# Patient Record
Sex: Female | Born: 1935 | Race: White | Hispanic: No | Marital: Married | Smoking: Former smoker
Health system: Southern US, Community
[De-identification: ages and names within clinical notes are randomized; demographics above are authoritative.]

## PROBLEM LIST (undated history)

## (undated) ENCOUNTER — Ambulatory Visit (HOSPITAL_BASED_OUTPATIENT_CLINIC_OR_DEPARTMENT_OTHER): Admission: EM | Source: Home / Self Care

## (undated) DIAGNOSIS — E039 Hypothyroidism, unspecified: Secondary | ICD-10-CM

## (undated) DIAGNOSIS — I10 Essential (primary) hypertension: Secondary | ICD-10-CM

## (undated) HISTORY — PX: CHOLECYSTECTOMY: SHX55

## (undated) HISTORY — PX: BREAST SURGERY: SHX581

## (undated) HISTORY — PX: OTHER SURGICAL HISTORY: SHX169

## (undated) HISTORY — PX: ABDOMINAL HYSTERECTOMY: SHX81

---

## 2004-09-01 ENCOUNTER — Ambulatory Visit: Payer: Self-pay | Admitting: Family Medicine

## 2004-10-06 ENCOUNTER — Ambulatory Visit: Payer: Self-pay | Admitting: Family Medicine

## 2004-10-21 ENCOUNTER — Ambulatory Visit: Payer: Self-pay | Admitting: Family Medicine

## 2004-11-24 ENCOUNTER — Ambulatory Visit: Payer: Self-pay | Admitting: Family Medicine

## 2004-12-10 ENCOUNTER — Ambulatory Visit: Payer: Self-pay | Admitting: Family Medicine

## 2005-04-21 ENCOUNTER — Ambulatory Visit: Payer: Self-pay | Admitting: Family Medicine

## 2005-07-30 ENCOUNTER — Ambulatory Visit: Payer: Self-pay | Admitting: Family Medicine

## 2015-11-01 ENCOUNTER — Encounter (HOSPITAL_COMMUNITY): Payer: Self-pay | Admitting: Family Medicine

## 2015-11-01 ENCOUNTER — Observation Stay (HOSPITAL_COMMUNITY)
Admission: AD | Admit: 2015-11-01 | Discharge: 2015-11-03 | Disposition: A | Discharge status: Home / Self Care | Payer: Medicare HMO | Source: Other Acute Inpatient Hospital | Attending: Internal Medicine | Admitting: Internal Medicine

## 2015-11-01 DIAGNOSIS — I10 Essential (primary) hypertension: Secondary | ICD-10-CM | POA: Diagnosis not present

## 2015-11-01 DIAGNOSIS — R42 Dizziness and giddiness: Secondary | ICD-10-CM | POA: Diagnosis not present

## 2015-11-01 DIAGNOSIS — I129 Hypertensive chronic kidney disease with stage 1 through stage 4 chronic kidney disease, or unspecified chronic kidney disease: Secondary | ICD-10-CM | POA: Diagnosis not present

## 2015-11-01 DIAGNOSIS — M6289 Other specified disorders of muscle: Secondary | ICD-10-CM | POA: Diagnosis not present

## 2015-11-01 DIAGNOSIS — Z87891 Personal history of nicotine dependence: Secondary | ICD-10-CM | POA: Insufficient documentation

## 2015-11-01 DIAGNOSIS — E039 Hypothyroidism, unspecified: Secondary | ICD-10-CM | POA: Diagnosis not present

## 2015-11-01 DIAGNOSIS — Z6829 Body mass index (BMI) 29.0-29.9, adult: Secondary | ICD-10-CM | POA: Insufficient documentation

## 2015-11-01 DIAGNOSIS — R531 Weakness: Secondary | ICD-10-CM | POA: Diagnosis present

## 2015-11-01 DIAGNOSIS — N183 Chronic kidney disease, stage 3 unspecified: Secondary | ICD-10-CM | POA: Diagnosis present

## 2015-11-01 DIAGNOSIS — Z7982 Long term (current) use of aspirin: Secondary | ICD-10-CM | POA: Diagnosis not present

## 2015-11-01 DIAGNOSIS — E669 Obesity, unspecified: Secondary | ICD-10-CM | POA: Insufficient documentation

## 2015-11-01 DIAGNOSIS — I6782 Cerebral ischemia: Secondary | ICD-10-CM | POA: Diagnosis not present

## 2015-11-01 DIAGNOSIS — E785 Hyperlipidemia, unspecified: Secondary | ICD-10-CM | POA: Insufficient documentation

## 2015-11-01 DIAGNOSIS — G459 Transient cerebral ischemic attack, unspecified: Secondary | ICD-10-CM | POA: Diagnosis not present

## 2015-11-01 DIAGNOSIS — K219 Gastro-esophageal reflux disease without esophagitis: Secondary | ICD-10-CM | POA: Insufficient documentation

## 2015-11-01 DIAGNOSIS — G450 Vertebro-basilar artery syndrome: Secondary | ICD-10-CM

## 2015-11-01 HISTORY — DX: Hypothyroidism, unspecified: E03.9

## 2015-11-01 HISTORY — DX: Essential (primary) hypertension: I10

## 2015-11-01 LAB — CBC
HCT: 40.8 % (ref 36.0–46.0)
Hemoglobin: 13.1 g/dL (ref 12.0–15.0)
MCH: 30.2 pg (ref 26.0–34.0)
MCHC: 32.1 g/dL (ref 30.0–36.0)
MCV: 94 fL (ref 78.0–100.0)
PLATELETS: 355 10*3/uL (ref 150–400)
RBC: 4.34 MIL/uL (ref 3.87–5.11)
RDW: 12.9 % (ref 11.5–15.5)
WBC: 6.4 10*3/uL (ref 4.0–10.5)

## 2015-11-01 LAB — COMPREHENSIVE METABOLIC PANEL
ALBUMIN: 3.6 g/dL (ref 3.5–5.0)
ALK PHOS: 55 U/L (ref 38–126)
ALT: 20 U/L (ref 14–54)
ANION GAP: 9 (ref 5–15)
AST: 25 U/L (ref 15–41)
BUN: 19 mg/dL (ref 6–20)
CHLORIDE: 105 mmol/L (ref 101–111)
CO2: 26 mmol/L (ref 22–32)
Calcium: 9.1 mg/dL (ref 8.9–10.3)
Creatinine, Ser: 1.33 mg/dL — ABNORMAL HIGH (ref 0.44–1.00)
GFR calc non Af Amer: 37 mL/min — ABNORMAL LOW (ref >60)
GFR, EST AFRICAN AMERICAN: 43 mL/min — AB (ref >60)
GLUCOSE: 92 mg/dL (ref 65–99)
POTASSIUM: 3.8 mmol/L (ref 3.5–5.1)
SODIUM: 140 mmol/L (ref 135–145)
Total Bilirubin: 0.6 mg/dL (ref 0.3–1.2)
Total Protein: 6.9 g/dL (ref 6.5–8.1)

## 2015-11-01 LAB — PROTIME-INR
INR: 1.03 (ref 0.00–1.49)
Prothrombin Time: 13.7 seconds (ref 11.6–15.2)

## 2015-11-01 LAB — GLUCOSE, CAPILLARY: GLUCOSE-CAPILLARY: 89 mg/dL (ref 65–99)

## 2015-11-01 LAB — TSH: TSH: 3.051 u[IU]/mL (ref 0.350–4.500)

## 2015-11-01 MED ORDER — ASPIRIN 325 MG PO TABS
325.0000 mg | ORAL_TABLET | Freq: Every day | ORAL | Status: DC
  Administered 2015-11-01 – 2015-11-02 (×2): 325 mg via ORAL
  Filled 2015-11-01 (×2): qty 1

## 2015-11-01 MED ORDER — PRAVASTATIN SODIUM 40 MG PO TABS
40.0000 mg | ORAL_TABLET | Freq: Every day | ORAL | Status: DC
  Administered 2015-11-01 – 2015-11-02 (×2): 40 mg via ORAL
  Filled 2015-11-01: qty 1

## 2015-11-01 MED ORDER — STROKE: EARLY STAGES OF RECOVERY BOOK
Freq: Once | Status: AC
  Administered 2015-11-01: 22:00:00

## 2015-11-01 MED ORDER — ACETAMINOPHEN 650 MG RE SUPP: 650.0000 mg | RECTAL | Status: DC | PRN

## 2015-11-01 MED ORDER — SENNOSIDES-DOCUSATE SODIUM 8.6-50 MG PO TABS: 1.0000 | ORAL_TABLET | Freq: Every evening | ORAL | Status: DC | PRN

## 2015-11-01 MED ORDER — SODIUM CHLORIDE 0.9 % IV BOLUS (SEPSIS)
500.0000 mL | Freq: Once | INTRAVENOUS | Status: AC
Start: 2015-11-01 — End: 2015-11-01
  Administered 2015-11-01: 500 mL via INTRAVENOUS

## 2015-11-01 MED ORDER — ENOXAPARIN SODIUM 40 MG/0.4ML ~~LOC~~ SOLN
40.0000 mg | SUBCUTANEOUS | Status: DC
  Administered 2015-11-01 – 2015-11-02 (×2): 40 mg via SUBCUTANEOUS
  Filled 2015-11-01 (×2): qty 0.4

## 2015-11-01 MED ORDER — TRIMETHOPRIM 100 MG PO TABS
100.0000 mg | ORAL_TABLET | Freq: Two times a day (BID) | ORAL | Status: DC
  Administered 2015-11-01 – 2015-11-03 (×4): 100 mg via ORAL
  Filled 2015-11-01 (×4): qty 1

## 2015-11-01 MED ORDER — ACETAMINOPHEN 325 MG PO TABS
650.0000 mg | ORAL_TABLET | ORAL | Status: DC | PRN
  Administered 2015-11-03: 650 mg via ORAL
  Filled 2015-11-01: qty 2

## 2015-11-01 MED ORDER — PANTOPRAZOLE SODIUM 40 MG PO TBEC
40.0000 mg | DELAYED_RELEASE_TABLET | Freq: Every day | ORAL | Status: DC
  Administered 2015-11-01 – 2015-11-03 (×3): 40 mg via ORAL
  Filled 2015-11-01 (×3): qty 1

## 2015-11-01 MED ORDER — AMLODIPINE BESYLATE 10 MG PO TABS
10.0000 mg | ORAL_TABLET | Freq: Every day | ORAL | Status: DC
  Administered 2015-11-02 – 2015-11-03 (×2): 10 mg via ORAL
  Filled 2015-11-01 (×2): qty 1

## 2015-11-01 MED ORDER — BISOPROLOL-HYDROCHLOROTHIAZIDE 10-6.25 MG PO TABS
1.0000 | ORAL_TABLET | Freq: Every day | ORAL | Status: DC
  Administered 2015-11-02: 1 via ORAL
  Filled 2015-11-01 (×2): qty 1

## 2015-11-01 MED ORDER — BENAZEPRIL HCL 20 MG PO TABS
40.0000 mg | ORAL_TABLET | Freq: Every day | ORAL | Status: DC
  Administered 2015-11-02: 40 mg via ORAL
  Filled 2015-11-01: qty 2

## 2015-11-01 MED ORDER — ASPIRIN 300 MG RE SUPP: 300.0000 mg | Freq: Every day | RECTAL | Status: DC

## 2015-11-01 NOTE — Progress Notes (Signed)
Received from [place] transport into [id number]; [facility] alerted to arrival; await admission orders. Patient is arousable; oriented to room and unit routine.

## 2015-11-01 NOTE — Consult Note (Signed)
Admission H&P    Chief Complaint: Acute onset of vertigo and right lower extremity weakness.  HPI: Erin Oliver is an 80 y.o. female history of hypertension, hyperlipidemia and hypothyroidism, transferred from [facility] for further evaluation to rule out acute stroke. She experienced acute onset of vertigo and heaviness involving her right lower extremity with difficulty with ambulation at 1:30 AM today. She has no previous history of stroke nor TIA. She has not been on antiplatelet therapy. CT scan of her head showed no acute intracranial abnormality. Deficits improved rapidly. TPA was not administered. Patient has not had a recurrence of severe ago normal right lower extremity weakness. She had no weakness of her right upper extremity andchanges were noted. There was also no facial droop. NIH stroke score at the time of this evaluation was 0.  LSN: 1:30 AM on 11/01/2015 tPA Given: No: Rapid resolution of deficits mRankin:  Past Medical History  Diagnosis Date  . Hypertension   . Hypothyroidism     Past Surgical History  Procedure Laterality Date  . Breast surgery    . Abdominal hysterectomy    . Cholecystectomy    . Right knee arthroscopy      Family History  Problem Relation Age of Onset  . Leukemia Mother   . Congestive Heart Failure Mother   . Lung cancer Sister   . Heart disease Brother    Social History:  reports that she has quit smoking. Her smoking use included Cigarettes. She does not have any smokeless tobacco history on file. She reports that she does not drink alcohol. Her drug history is not on file.  Allergies:  Allergies  Allergen Reactions  . Penicillins Hives    Medications Prior to Admission  Medication Sig Dispense Refill  . amLODipine (NORVASC) 10 MG tablet Take 10 mg by mouth daily.    . benazepril (LOTENSIN) 40 MG tablet Take 40 mg by mouth daily.    . bisoprolol-hydrochlorothiazide (ZIAC) 10-6.25 MG tablet Take 1 tablet by mouth daily.     Marland Kitchen lovastatin (MEVACOR) 40 MG tablet Take 40 mg by mouth at bedtime.    Marland Kitchen omeprazole (PRILOSEC) 20 MG capsule Take 20 mg by mouth daily.    Marland Kitchen trimethoprim (TRIMPEX) 100 MG tablet Take 100 mg by mouth 2 (two) times daily.      ROS: History obtained from the patient  General ROS: negative for - chills, fatigue, fever, night sweats, weight gain or weight loss Psychological ROS: negative for - behavioral disorder, hallucinations, memory difficulties, mood swings or suicidal ideation Ophthalmic ROS: negative for - blurry vision, double vision, eye pain or loss of vision ENT ROS: negative for - epistaxis, nasal discharge, oral lesions, sore throat, tinnitus or vertigo Allergy and Immunology ROS: negative for - hives or itchy/watery eyes Hematological and Lymphatic ROS: negative for - bleeding problems, bruising or swollen lymph nodes Endocrine ROS: negative for - galactorrhea, hair pattern changes, polydipsia/polyuria or temperature intolerance Respiratory ROS: negative for - cough, hemoptysis, shortness of breath or wheezing Cardiovascular ROS: negative for - chest pain, dyspnea on exertion, edema or irregular heartbeat Gastrointestinal ROS: negative for - abdominal pain, diarrhea, hematemesis, nausea/vomiting or stool incontinence Genito-Urinary ROS: negative for - dysuria, hematuria, incontinence or urinary frequency/urgency Musculoskeletal ROS: negative for - joint swelling or muscular weakness Neurological ROS: as noted in HPI Dermatological ROS: negative for rash and skin lesion changes  Physical Examination: Blood pressure 119/57, pulse 57, temperature 98.1 F (36.7 C), temperature source Oral, resp. rate 16,  height 5\' 4"  (1.626 m), weight 78.79 kg (173 lb 11.2 oz), SpO2 93 %.  HEENT-  Normocephalic, no lesions, without obvious abnormality.  Normal external eye and conjunctiva.  Normal TM's bilaterally.  Normal auditory canals and external ears. Normal external nose, mucus membranes and  septum.  Normal pharynx. Neck supple with no masses, nodes, nodules or enlargement. Cardiovascular - regular rate and rhythm, S1, S2 normal, no murmur, click, rub or gallop Lungs - chest clear, no wheezing, rales, normal symmetric air entry Abdomen - soft, non-tender; bowel sounds normal; no masses,  no organomegaly Extremities - no joint deformities, effusion, or inflammation and no edema  Neurologic Examination: Mental Status: Alert, oriented, thought content appropriate.  Speech fluent without evidence of aphasia. Able to follow commands without difficulty. Cranial Nerves: II-Visual fields were normal. III/IV/VI-Pupils were equal and reacted normally to light. Extraocular movements were full and conjugate.    V/VII-no facial numbness and no facial weakness. VIII-normal. X-normal speech and symmetrical palatal movement. XI: trapezius strength/neck flexion strength normal bilaterally XII-midline tongue extension with normal strength. Motor: 5/5 bilaterally with normal tone and bulk Sensory: Normal throughout. Deep Tendon Reflexes: 1+ and symmetric. Plantars: Mute bilaterally Cerebellar: Normal finger-to-nose testing. Carotid auscultation: Normal  Results for orders placed or performed during the hospital encounter of 11/01/15 (from the past 48 hour(s))  Glucose, capillary     Status: None   Collection Time: 11/01/15  7:44 PM  Result Value Ref Range   Glucose-Capillary 89 65 - 99 mg/dL   Comment 1 Notify RN    Comment 2 Document in Chart   CBC     Status: None   Collection Time: 11/01/15  8:55 PM  Result Value Ref Range   WBC 6.4 4.0 - 10.5 K/uL   RBC 4.34 3.87 - 5.11 MIL/uL   Hemoglobin 13.1 12.0 - 15.0 g/dL   HCT 40.8 36.0 - 46.0 %   MCV 94.0 78.0 - 100.0 fL   MCH 30.2 26.0 - 34.0 pg   MCHC 32.1 30.0 - 36.0 g/dL   RDW 12.9 11.5 - 15.5 %   Platelets 355 150 - 400 K/uL  Protime-INR     Status: None   Collection Time: 11/01/15  8:55 PM  Result Value Ref Range    Prothrombin Time 13.7 11.6 - 15.2 seconds   INR 1.03 0.00 - 1.49   No results found.  Assessment: 80 y.o. female with multiple risk factors for stroke presenting with probable transient ischemic attack. However, a small acute infarction cannot be ruled out at this point.  Stroke Risk Factors - hyperlipidemia and hypertension  Plan: 1. HgbA1c, fasting lipid panel 2. MRI, MRA  of the brain without contrast 3. PT consult, OT consult, Speech consult 4. Echocardiogram 5. Carotid dopplers 6. Prophylactic therapy-Antiplatelet med: Aspirin  7. Risk factor modification 8. Telemetry monitoring  C.R. Nicole Kindred, MD [facility] [phone]  11/01/2015, 9:45 PM

## 2015-11-01 NOTE — H&P (Signed)
History and Physical  Patient Name: Gwendy Redhead Riveron     [id number]    DOB: 10-Nov-1935    DOA: 11/01/2015 PCP: No primary care provider on file.   Patient coming from: Home     Chief Complaint: Vertigo  HPI: NIAYLA DEWAN is a 80 y.o. female with a past medical history significant for HTN who presents with vertigo and blurry vision.  The patient was in her usual state of health until last night 5/13 at 0130 when she got up from bed, felt sudden onset severe vertigo with sitting up, then nausea and NBNB emesis as well as blurry vision spinning vision.  She presented to the ER at [facility] where she was noted to have R > L tremor and right leg weakness and blurry vision.  CT head was negative, MRI was not available and tPA was not administered (presumably because her vision and right leg weakness were reportedly resolving) but the patient was requested to be transferred to [facility].       Review of Systems:  Pt complains of blurry vision, mild vertigo, malaise, nausea, headache, fatigue, discomfort in bilateral ears "like fluid in my head", photophobia, right leg weakness, resolved. Pt denies any numbness, slurred speech, dysarthria, difficulty swallowing or choking.  All other systems negative except as just noted or noted in the history of present illness.  Past Medical History  Diagnosis Date  . Hypertension   . Hypothyroidism     Past Surgical History  Procedure Laterality Date  . Breast surgery    . Abdominal hysterectomy    . Cholecystectomy    . Right knee arthroscopy      Social History: Patient lives with her husband and son.  Patient walks unassisted.  She mows the lawn and tills the yard.  She is a remote former smoker.  No alcohol.    Allergies  Allergen Reactions  . Penicillins Hives    Family history: family history includes Congestive Heart Failure in her mother; Heart disease in her brother; Leukemia in her mother; Lung cancer in her  sister.  Prior to Admission medications   Medication Sig Start Date End Date Taking? Authorizing Provider  amLODipine (NORVASC) 10 MG tablet Take 10 mg by mouth daily.   Yes Historical Provider, MD  benazepril (LOTENSIN) 40 MG tablet Take 40 mg by mouth daily.   Yes Historical Provider, MD  bisoprolol-hydrochlorothiazide (ZIAC) 10-6.25 MG tablet Take 1 tablet by mouth daily.   Yes Historical Provider, MD  lovastatin (MEVACOR) 40 MG tablet Take 40 mg by mouth at bedtime.   Yes Historical Provider, MD  omeprazole (PRILOSEC) 20 MG capsule Take 20 mg by mouth daily.   Yes Historical Provider, MD  trimethoprim (TRIMPEX) 100 MG tablet Take 100 mg by mouth 2 (two) times daily.   Yes Historical Provider, MD     Physical Exam: BP 119/57 mmHg  Pulse 57  Temp(Src) 98.1 F (36.7 C) (Oral)  Resp 16  Ht 5\' 4"  (1.626 m)  Wt 78.79 kg (173 lb 11.2 oz)  BMI 29.80 kg/m2  SpO2 93% General appearance: Well-developed, elderly adult female, alert and in mild distress from nausea.   Eyes: Anicteric, conjunctiva pink, lids and lashes normal. Right ptyrgium.      ENT: No nasal deformity, discharge, or epistaxis.  OP moist without lesions.  TMs bilaterally reflective, red but without effusion or bulging. Lymph: No cervical, supraclavicular or axillary lymphadenopathy.  No thyromegaly. Skin: Warm and dry.  No jaundice.  No suspicious rashes or lesions. Cardiac: RRR, nl S1-S2, no murmurs appreciated.  Capillary refill is brisk.  No LE edema.  Radial and DP pulses 2+ and symmetric. Respiratory: Normal respiratory rate and rhythm.  CTAB without rales or wheezes. Abdomen: Abdomen soft without rigidity.  No rebound or guarding. No ascites, distension.   MSK: No deformities or effusions. Neuro: Pupils are 5 mm and reactive to 3 mm. Extraocular movements are intact, without nystagmus.Saccade noted with horizontal tracking to RIGHT. Cranial nerve 5 is within normal limits. Cranial nerve 7 is symmetrical. Cranial  nerve 8 is within normal limits. Cranial nerves 9 and 10 reveal equal palate elevation. Cranial nerve 11 reveals sternocleidomastoid strong. Cranial nerve 12 is midline. I do not note a deficit in motor strength testing in the upper and lower extremities bilaterally with normal motor, tone and bulk for age. There is overall 4/5 strength in both legs.  Finger-to-nose testing has intention tremor on RIGHT. The patient is oriented to time, place and person. Speech is fluent. Naming is grossly intact. Recall, recent and remote, as well as general fund of knowledge seem within normal limits. Attention span and concentration are within normal limits.   Psych: Behavior appropriate.  Affect anxious.  No evidence of aural or visual hallucinations or delusions.       Labs on Admission from [facility]:  The metabolic panel shows Na [id number], K 4.2, Cr 1.4 (no baseline), BUN to creatinine somewhat up.  Glucose normal.  AG normal. LFT normal. INR 1 Troponin I negative x2. UA without pyuria or hematuria. The complete blood count shows no leukocytosis, anemia and platelets 338.   Radiological Exams on Admission: CT head without contrast 11/01/2015  Negative  CXR report reviewed from [facility]: Negative for infiltrate or edema    EKG: Independently reviewed. Rate 67, QTc 407, nonspecific anterior repol abnormalities without ST changes    Assessment/Plan 1. Acute Stroke or TIA:  This is new.  ABCD score 3.  Posterior circulation syndrome suspected. -Admit to telemetry -Neuro checks, NIHSS per protocol -Daily aspirin 325 mg -Lipids, hemoglobin A1c -MRI and MRA of head/brain ordered -Consult to Neurology, appreciate recommendations -Carotid imaging and echo ordered -PT/OT/SLP ordered   2. Possible hypothyroidism:  Reports elevated TSH this spring -Check TSH  3. HTN:  -Continue home amlodipine, benazepril, HCTZ and bisoprolol  4. GERD:  Stable.  -Continue PPI      DVT  prophylaxis: Lovenox  Code Status: FULL  Family Communication: Son and husband at bedside, CODE STATUS confirmed  Disposition Plan: Anticipate Stroke work up as above and consult to ancillary services.  Expect discharge within 1-2 days. Consults called: Neurology, Dr. Nicole Kindred has seen patient. Admission status: Telemetry, observation status   Medical decision making: Patient seen at 8:25 PM on 11/01/2015.  The patient was discussed with Dr. Nicole Kindred. What exists of the patient's chart from [facility] was reviewed in depth.  Clinical condition: stable.       Edwin Dada [facility] Pager [phone]

## 2015-11-02 ENCOUNTER — Observation Stay (HOSPITAL_COMMUNITY): Payer: Medicare HMO

## 2015-11-02 ENCOUNTER — Observation Stay (HOSPITAL_BASED_OUTPATIENT_CLINIC_OR_DEPARTMENT_OTHER): Payer: Medicare HMO

## 2015-11-02 DIAGNOSIS — I10 Essential (primary) hypertension: Secondary | ICD-10-CM

## 2015-11-02 DIAGNOSIS — R42 Dizziness and giddiness: Secondary | ICD-10-CM | POA: Diagnosis not present

## 2015-11-02 DIAGNOSIS — N183 Chronic kidney disease, stage 3 (moderate): Secondary | ICD-10-CM | POA: Diagnosis not present

## 2015-11-02 DIAGNOSIS — G459 Transient cerebral ischemic attack, unspecified: Secondary | ICD-10-CM

## 2015-11-02 DIAGNOSIS — R55 Syncope and collapse: Secondary | ICD-10-CM

## 2015-11-02 DIAGNOSIS — M6289 Other specified disorders of muscle: Secondary | ICD-10-CM

## 2015-11-02 LAB — LIPID PANEL
Cholesterol: 149 mg/dL (ref 0–200)
HDL: 36 mg/dL — ABNORMAL LOW (ref >40)
LDL Cholesterol: 70 mg/dL (ref 0–99)
Total CHOL/HDL Ratio: 4.1 RATIO
Triglycerides: 214 mg/dL — ABNORMAL HIGH (ref <150)
VLDL: 43 mg/dL — ABNORMAL HIGH (ref 0–40)

## 2015-11-02 LAB — GLUCOSE, CAPILLARY
GLUCOSE-CAPILLARY: 138 mg/dL — AB (ref 65–99)
GLUCOSE-CAPILLARY: 103 mg/dL — AB (ref 65–99)
Glucose-Capillary: 132 mg/dL — ABNORMAL HIGH (ref 65–99)
Glucose-Capillary: 102 mg/dL — ABNORMAL HIGH (ref 65–99)

## 2015-11-02 LAB — ECHOCARDIOGRAM COMPLETE
Height: 64 in
WEIGHTICAEL: 2779.2 [oz_av]

## 2015-11-02 MED ORDER — ASPIRIN 81 MG PO CHEW
81.0000 mg | CHEWABLE_TABLET | Freq: Every day | ORAL | Status: DC
  Administered 2015-11-02 – 2015-11-03 (×2): 81 mg via ORAL
  Filled 2015-11-02 (×2): qty 1

## 2015-11-02 MED ORDER — FENOFIBRATE 54 MG PO TABS
54.0000 mg | ORAL_TABLET | Freq: Every day | ORAL | Status: DC
  Administered 2015-11-02 – 2015-11-03 (×2): 54 mg via ORAL
  Filled 2015-11-02 (×2): qty 1

## 2015-11-02 NOTE — Progress Notes (Signed)
PROGRESS NOTE                                                                                                                                                                                                             Patient Demographics:    Erin Oliver, is a 80 y.o. female, DOB - [birth year], [id number]  Admit date - 11/01/2015   Admitting Physician Edwin Dada, MD  Outpatient Primary MD for the patient is No primary care provider on file.  LOS - 1  Outpatient Specialists:   No chief complaint on file.      Brief Narrative   Erin Oliver is a 80 y.o. female with a past medical history significant for HTN who presents with vertigo and blurry vision.  The patient was in her usual state of health until last night 5/13 at 0130 when she got up from bed, felt sudden onset severe vertigo with sitting up, then nausea and NBNB emesis as well as blurry vision spinning vision. She presented to the ER at [facility] where she was noted to have R > L tremor and right leg weakness and blurry vision. CT head was negative, MRI was not available and tPA was not administered (presumably because her vision and right leg weakness were reportedly resolving) but the patient was requested to be transferred to [facility].   Subjective:    Erin Oliver today has, No headache, No chest pain, No abdominal pain - No Nausea, No new weakness tingling or numbness, No Cough - SOB.  Says her head feels heavy today.   Assessment  & Plan :     1. Vertigo. Seems to have resolved. Initial suspicion for CVA, MRI brain rules out CVA, TIA workup pending, LDL is 70, neuro following. Complete full TIA workup. PT eval. Increase activity. If stable discharge in the morning.  2. Dyslipidemia. LDL 70, triglycerides elevated, continue statin at home dose, added TriCor.  3. Hypertension. Continue home medications which include HCTZ, bisoprolol,  ACE inhibitor and Norvasc.  4. GERD. On PPI.    Code Status : Full  Family Communication  : None present  Disposition Plan  : Home in the morning  Barriers For Discharge : TIA workup  Consults  :  Neuro  Procedures  :   MRI/MRA brain. No stroke. Nonacute.  Echogram  Carotid duplex  DVT Prophylaxis  :  Lovenox    Lab Results  Component Value Date   PLT 355 11/01/2015    Antibiotics  :     Anti-infectives    Start     Dose/Rate Route Frequency Ordered Stop   11/01/15 2200  trimethoprim (TRIMPEX) tablet 100 mg     100 mg Oral 2 times daily 11/01/15 2024          Objective:   Filed Vitals:   11/01/15 2330 11/02/15 0130 11/02/15 0330 11/02/15 0530  BP: 115/56 111/58 117/70 120/60  Pulse: 60 59  62  Temp: 98 F (36.7 C) 98 F (36.7 C) 97.9 F (36.6 C) 97.6 F (36.4 C)  TempSrc: Oral Oral Oral Oral  Resp: 18 18 18 18   Height:      Weight:      SpO2: 95% 95% 96% 95%    Wt Readings from Last 3 Encounters:  11/01/15 78.79 kg (173 lb 11.2 oz)     Intake/Output Summary (Last 24 hours) at 11/02/15 1041 Last data filed at 11/02/15 0856  Gross per 24 hour  Intake    360 ml  Output      0 ml  Net    360 ml     Physical Exam  Awake Alert, Oriented X 3, No new F.N deficits, Normal affect [place].AT,PERRAL Supple Neck,No JVD, No cervical lymphadenopathy appriciated.  Symmetrical Chest wall movement, Good air movement bilaterally, CTAB RRR,No Gallops,Rubs or new Murmurs, No Parasternal Heave +ve B.Sounds, Abd Soft, No tenderness, No organomegaly appriciated, No rebound - guarding or rigidity. No Cyanosis, Clubbing or edema, No new Rash or bruise       Data Review:    CBC  Recent Labs Lab 11/01/15 2055  WBC 6.4  HGB 13.1  HCT 40.8  PLT 355  MCV 94.0  MCH 30.2  MCHC 32.1  RDW 12.9    Chemistries   Recent Labs Lab 11/01/15 2055  NA 140  K 3.8  CL 105  CO2 26  GLUCOSE 92  BUN 19  CREATININE 1.33*  CALCIUM 9.1  AST 25  ALT 20    ALKPHOS 55  BILITOT 0.6   ------------------------------------------------------------------------------------------------------------------  Recent Labs  11/02/15 0538  CHOL 149  HDL 36*  LDLCALC 70  TRIG 214*  CHOLHDL 4.1    No results found for: HGBA1C ------------------------------------------------------------------------------------------------------------------  Recent Labs  11/01/15 2055  TSH 3.051   ------------------------------------------------------------------------------------------------------------------ No results for input(s): VITAMINB12, FOLATE, FERRITIN, TIBC, IRON, RETICCTPCT in the last 72 hours.  Coagulation profile  Recent Labs Lab 11/01/15 2055  INR 1.03    No results for input(s): DDIMER in the last 72 hours.  Cardiac Enzymes No results for input(s): CKMB, TROPONINI, MYOGLOBIN in the last 168 hours.  Invalid input(s): CK ------------------------------------------------------------------------------------------------------------------ No results found for: BNP  Inpatient Medications  Scheduled Meds: . amLODipine  10 mg Oral Daily  . aspirin  300 mg Rectal Daily   Or  . aspirin  325 mg Oral Daily  . benazepril  40 mg Oral Daily  . bisoprolol-hydrochlorothiazide  1 tablet Oral Daily  . enoxaparin (LOVENOX) injection  40 mg Subcutaneous Q24H  . pantoprazole  40 mg Oral Daily  . pravastatin  40 mg Oral q1800  . trimethoprim  100 mg Oral BID   Continuous Infusions:  PRN Meds:.acetaminophen **OR** acetaminophen, senna-docusate  Micro Results No results found for this or any previous visit (from the past 240 hour(s)).  Radiology Reports Mr Brain  Wo Contrast  11/02/2015  CLINICAL DATA:  Personal history of hypertension and hyperlipidemia. Acute onset of vertigo and right-sided weakness. EXAM: MRI HEAD WITHOUT CONTRAST MRA HEAD WITHOUT CONTRAST TECHNIQUE: Multiplanar, multiecho pulse sequences of the brain and surrounding structures  were obtained without intravenous contrast. Angiographic images of the head were obtained using MRA technique without contrast. COMPARISON:  None. FINDINGS: MRI HEAD FINDINGS Diffusion imaging does not show any acute or subacute infarction. The brainstem and cerebellum are normal. Within the cerebral hemispheres, there are scattered foci of T2 and FLAIR signal within the deep and subcortical white matter consistent with ordinary small vessel disease, mild to moderate in degree. No cortical or large vessel territory infarction. No mass lesion, hemorrhage, hydrocephalus or extra-axial collection. No pituitary mass. No inflammatory sinus disease. Tiny amount of fluid in the mastoid air cells, probably subclinical. MRA HEAD FINDINGS Both internal carotid arteries are widely patent into the brain. The anterior and middle cerebral vessels are normal without proximal stenosis, aneurysm or vascular malformation. Fetal origin of both posterior cerebral arteries. 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 Both vertebral arteries are patent to the basilar. No basilar stenosis. Posterior circulation branch vessels are normal. Posterior circulation vessels are somewhat diminutive due to the fetal origin of the PCAs. IMPRESSION: No acute finding. Mild to moderate chronic small-vessel ischemic change of the cerebral hemispheric white matter, fairly typical for age. Negative intracranial MR angiography. No cause of the presenting symptoms is identified. Electronically Signed   By: Nelson Chimes M.D.   On: 11/02/2015 07:11   Mr Lovenia Kim  11/02/2015  CLINICAL DATA:  Personal history of hypertension and hyperlipidemia. Acute onset of vertigo and right-sided weakness. EXAM: MRI HEAD WITHOUT CONTRAST MRA HEAD WITHOUT CONTRAST TECHNIQUE: Multiplanar, multiecho pulse sequences of the brain and surrounding structures were obtained without intravenous contrast. Angiographic images of the head were obtained using MRA  technique without contrast. COMPARISON:  None. FINDINGS: MRI HEAD FINDINGS Diffusion imaging does not show any acute or subacute infarction. The brainstem and cerebellum are normal. Within the cerebral hemispheres, there are scattered foci of T2 and FLAIR signal within the deep and subcortical white matter consistent with ordinary small vessel disease, mild to moderate in degree. No cortical or large vessel territory infarction. No mass lesion, hemorrhage, hydrocephalus or extra-axial collection. No pituitary mass. No inflammatory sinus disease. Tiny amount of fluid in the mastoid air cells, probably subclinical. MRA HEAD FINDINGS Both internal carotid arteries are widely patent into the brain. The anterior and middle cerebral vessels are normal without proximal stenosis, aneurysm or vascular malformation. Fetal origin of both posterior cerebral arteries. 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 Both vertebral arteries are patent to the basilar. No basilar stenosis. Posterior circulation branch vessels are normal. Posterior circulation vessels are somewhat diminutive due to the fetal origin of the PCAs. IMPRESSION: No acute finding. Mild to moderate chronic small-vessel ischemic change of the cerebral hemispheric white matter, fairly typical for age. Negative intracranial MR angiography. No cause of the presenting symptoms is identified. Electronically Signed   By: Nelson Chimes M.D.   On: 11/02/2015 07:11    Time Spent in minutes  30   Amberrose Friebel K M.D on 11/02/2015 at 10:41 AM  Between 7am to 7pm - Pager - [phone]  After 7pm go to [email or web address] - password [facility]  [facility] -  Office  [phone]

## 2015-11-02 NOTE — Progress Notes (Signed)
*  PRELIMINARY RESULTS* Vascular Ultrasound Carotid Duplex (Doppler) has been completed.  There is no obvious evidence of hemodynamically significant internal carotid artery stenosis bilaterally. Vertebral arteries are patent with antegrade flow.  11/02/2015 6:25 PM Maudry Mayhew, RVT, RDCS, RDMS

## 2015-11-02 NOTE — Progress Notes (Signed)
STROKE TEAM PROGRESS NOTE   HISTORY OF PRESENT ILLNESS Erin Oliver is an 80 y.o. female history of hypertension, hyperlipidemia and hypothyroidism, transferred from [facility] for further evaluation to rule out acute stroke. She experienced acute onset of vertigo and heaviness involving her right lower extremity with difficulty with ambulation at 1:30 AM today. She has no previous history of stroke nor TIA. She has not been on antiplatelet therapy. CT scan of her head showed no acute intracranial abnormality. Deficits improved rapidly. TPA was not administered. Patient has not had a recurrence of severe ago normal right lower extremity weakness. She had no weakness of her right upper extremity andchanges were noted. There was also no facial droop. NIH stroke score at the time of this evaluation was 0.  LSN: 1:30 AM on 11/01/2015 tPA Given: No: Rapid resolution of deficits mRankin:   SUBJECTIVE (INTERVAL HISTORY) Patient is better, she has a similar history of vertigo for years but this was worse. She is having left ear fluid. Patient noticed vertigo in bed. She started throwing up when she rolled over. But the symptoms were never this bad. Denies any weakness with symptoms.    OBJECTIVE Temp:  [97.6 F (36.4 C)-98.4 F (36.9 C)] 97.6 F (36.4 C) (05/14 0530) Pulse Rate:  [57-79] 62 (05/14 0530) Cardiac Rhythm:  [-] Normal sinus rhythm (05/13 2109) Resp:  [16-18] 18 (05/14 0530) BP: (111-121)/(56-70) 120/60 mmHg (05/14 0530) SpO2:  [93 %-96 %] 95 % (05/14 0530) Weight:  [78.79 kg (173 lb 11.2 oz)] 78.79 kg (173 lb 11.2 oz) (05/13 1931)  CBC:  Recent Labs Lab 11/01/15 2055  WBC 6.4  HGB 13.1  HCT 40.8  MCV 94.0  PLT [id number]    Basic Metabolic Panel:  Recent Labs Lab 11/01/15 2055  NA 140  K 3.8  CL 105  CO2 26  GLUCOSE 92  BUN 19  CREATININE 1.33*  CALCIUM 9.1    Lipid Panel:    Component Value Date/Time   CHOL 149 11/02/2015 0538   TRIG 214* 11/02/2015 0538    HDL 36* 11/02/2015 0538   CHOLHDL 4.1 11/02/2015 0538   VLDL 43* 11/02/2015 0538   LDLCALC 70 11/02/2015 0538   HgbA1c: No results found for: HGBA1C Urine Drug Screen: No results found for: LABOPIA, COCAINSCRNUR, LABBENZ, AMPHETMU, THCU, LABBARB    IMAGING  Mr Brain Wo Contrast 11/02/2015   No acute finding. Mild to moderate chronic small-vessel ischemic change of the cerebral hemispheric white matter, fairly typical for age. Negative intracranial MR angiography. No cause of the presenting symptoms is identified.     Mr Lovenia Kim 11/02/2015   No acute finding. Mild to moderate chronic small-vessel ischemic change of the cerebral hemispheric white matter, fairly typical for age. Negative intracranial MR angiography. No cause of the presenting symptoms is identified.   PHYSICAL EXAM  Physical exam: Exam: Gen: NAD, conversant, well nourised, obese, well groomed                     CV: RRR, no MRG. No Carotid Bruits. No peripheral edema, warm, nontender Eyes: Conjunctivae clear without exudates or hemorrhage  Neuro: Detailed Neurologic Exam  Speech:    Speech is normal; fluent and spontaneous with normal comprehension.  Cognition:    The patient is oriented to person, place, and time;    Cranial Nerves:    The pupils are equal, round, and reactive to light. Visual fields are full to finger confrontation. Extraocular movements are  intact. Trigeminal sensation is intact and the muscles of mastication are normal. The face is symmetric. The palate elevates in the midline. Hearing intact. Voice is normal. Shoulder shrug is normal. The tongue has normal motion without fasciculations.   Coordination:    No dysmetria Motor Observation:    No asymmetry, no atrophy, and no involuntary movements noted. Tone:    Normal muscle tone.      Strength:    Strength is V/V in the upper and lower limbs.      Sensation: intact to LT     Reflex Exam:  Toes:    The toes are downgoing  bilaterally.   Clonus:    Clonus is absent.       ASSESSMENT/PLAN [name] is a 80 y.o. female with history of hypertension, hyperlipidemia, and hypothyroidism presenting with vertigo and heaviness of the right lower extremity.  She did not receive IV t-PA due to rapid resolution of deficits.  Likely benign peripheral vertigo given history from patient today. She has fluid in the left ear. she has a similar history of vertigo for years but this was worse.  Patient noticed vertigo in bed. She started throwing up when she rolled over. But the symptoms were never this bad but past episodes similar. Denies any weakness with symptoms or any other associated deficits. Exam is non focal.    Resultant  resolved  MRI  no acute findings  MRA  negative  Carotid Doppler pending  2D Echo  EF 60-65%. No cardiac source of emboli identified.  LDL - 70  HgbA1c pending  VTE prophylaxis - Lovenox  Diet heart healthy/carb modified Room service appropriate?: Yes; Fluid consistency:: Thin  No antithrombotic prior to admission, now on aspirin 81 mg daily  Patient counseled to be compliant with her antithrombotic medications  Ongoing aggressive stroke risk factor management  Therapy recommendations: Home health physical therapy recommended  Disposition: Pending  Hypertension  Stable  Permissive hypertension (OK if < 220/120) but gradually normalize in 5-7 days  Hyperlipidemia  Home meds:  Mevacor 40 mg daily resumed in hospital  LDL 70, goal < 70  Continue statin at discharge    Other Stroke Risk Factors  Advanced age  Cigarette smoker, quit smoking.  Obesity, Body mass index is 29.8 kg/(m^2).    Other Active Problems  Mildly elevated creatinine 1.33  Hospital day # 1  Stroke team will sing off. She can be discharged on ASA 81mg  daily given small-vessel chronic ischemic disease on MRI.    Personally examined patient and images, and have participated in  and made any corrections needed to history, physical, neuro exam,assessment and plan as stated above.  I have personally obtained the history, evaluated lab date, reviewed imaging studies and agree with radiology interpretations.    Sarina Ill, MD Stroke Neurology [phone] [facility].       To contact Stroke Continuity provider, please refer to [email or web address] After hours, contact General Neurology

## 2015-11-02 NOTE — Evaluation (Signed)
Physical Therapy Evaluation Patient Details Name: Erin Oliver MRN: [id number] DOB: [birth year] Today's Date: 11/02/2015   History of Present Illness    80 y.o. female history of hypertension, hyperlipidemia and hypothyroidism, transferred from [facility] for further evaluation to rule out acute stroke. She experienced acute onset of vertigo and heaviness involving her right lower extremity with difficulty with ambulation at 1:30 AM today. She has no previous history of stroke nor TIA. She has not been on antiplatelet therapy. CT scan of her head showed no acute intracranial abnormality. Deficits improved rapidly. TPA was not administered. Patient has not had a recurrence of severe ago normal right lower extremity weakness. She had no weakness of her right upper extremity andchanges were noted. There was also no facial droop. NIH stroke score at the time of this evaluation was 0.   Clinical Impression  Pt presents with mild to moderate limitations to functional activity due to disequilibrium with activity, rapid onset of fatigue, resulting in dependency in gait requiring RW and at least supervision of another person for safety.  Expect pt will benefit from HHPT to assess continual resolution of symptoms.  Pt instructed to slowly incr activity to return to prior level, discuss appropriate use of antivertiginous medication with physician, to use RW at home for first few days as she regains stability, and to ask son to check in on her frequently initially after d/c.  No further acute PT needs, please ask RNCM to assist with HHPT.  Thank you    Follow Up Recommendations Home health PT    Equipment Recommendations  None recommended by PT    Recommendations for Other Services       Precautions / Restrictions Precautions Precautions: Fall Precaution Comments: use RW      Mobility  Bed Mobility Overal bed mobility: Independent                Transfers Overall transfer level:  Needs assistance Equipment used: Rolling walker (2 wheeled) Transfers: Sit to/from Stand Sit to Stand: Min guard         General transfer comment: standby for safety and to cue proper hand placement  Ambulation/Gait Ambulation/Gait assistance: Min assist Ambulation Distance (Feet): 100 Feet Assistive device: 1 person hand held assist;Rolling walker (2 wheeled) Gait Pattern/deviations: Step-through pattern;Staggering left;Staggering right;Trunk flexed;Decreased stride length Gait velocity: unmeasured but decr   General Gait Details: pt generally steady but when distracted or with chagning directions stumbles or drifts; endorses "a little" dizziness, better than at onset of symptoms but still disruptive  Stairs            Wheelchair Mobility    Modified Rankin (Stroke Patients Only) Modified Rankin (Stroke Patients Only) Pre-Morbid Rankin Score: No symptoms Modified Rankin: Moderately severe disability     Balance Overall balance assessment: Needs assistance Sitting-balance support: No upper extremity supported;Feet supported Sitting balance-Leahy Scale: Good     Standing balance support: During functional activity;No upper extremity supported Standing balance-Leahy Scale: Poor Standing balance comment: unable to maintain quiet standing unsupported, though requires minimal input to steady self; dynamic balance impacted with time, distraction,a nd course change         Rhomberg - Eyes Opened: 20 Rhomberg - Eyes Closed: 5                 Pertinent Vitals/Pain Pain Assessment: No/denies pain    Home Living Family/patient expects to be discharged to:: Private residence Living Arrangements: Spouse/significant other Available Help at Discharge: Family;Available PRN/intermittently  Type of Home: House Home Access: Level entry     Home Layout: One level Home Equipment: Walker - 2 wheels Additional Comments: 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; son can  check in on intermittently    Prior Function Level of Independence: Independent         Comments: does yard work without problem PTA     Hand Dominance   Dominant Hand: Right    Extremity/Trunk Assessment   Upper Extremity Assessment: Overall WFL for tasks assessed           Lower Extremity Assessment: Overall WFL for tasks assessed      Cervical / Trunk Assessment: Normal  Communication   Communication: No difficulties  Cognition Arousal/Alertness: Awake/alert Behavior During Therapy: WFL for tasks assessed/performed Overall Cognitive Status: Within Functional Limits for tasks assessed                      General Comments General comments (skin integrity, edema, etc.): instructed to slowly incr activity, to have help x 2-3 days, to take HHPT, to use RW and to take antivertig meds as needed for short term (as agreed upon with MD)    Exercises        Assessment/Plan    PT Assessment All further PT needs can be met in the next venue of care  PT Diagnosis Difficulty walking   PT Problem List Decreased activity tolerance;Decreased balance;Decreased mobility  PT Treatment Interventions     PT Goals (Current goals can be found in the Care Plan section) Acute Rehab PT Goals Patient Stated Goal: return to normal PT Goal Formulation: All assessment and education complete, DC therapy    Frequency     Barriers to discharge        Co-evaluation               End of Session   Activity Tolerance: Patient limited by fatigue Patient left: in bed;with call bell/phone within reach Nurse Communication: Mobility status    Functional Assessment Tool Used: rhomberg Functional Limitation: Mobility: Walking and moving around Mobility: Walking and Moving Around Current Status (V9563): At least 20 percent but less than 40 percent impaired, limited or restricted Mobility: Walking and Moving Around Goal Status [phone]): At least 1 percent but less than 20  percent impaired, limited or restricted    Time: 1502-1530 PT Time Calculation (min) (ACUTE ONLY): 28 min   Charges:   PT Evaluation $PT Eval Moderate Complexity: 1 Procedure PT Treatments $Self Care/Home Management: 8-22   PT G Codes:   PT G-Codes **NOT FOR INPATIENT CLASS** Functional Assessment Tool Used: rhomberg Functional Limitation: Mobility: Walking and moving around Mobility: Walking and Moving Around Current Status (P3295): At least 20 percent but less than 40 percent impaired, limited or restricted Mobility: Walking and Moving Around Goal Status [phone]): At least 1 percent but less than 20 percent impaired, limited or restricted    Herbie Drape 11/02/2015, 4:12 PM

## 2015-11-03 DIAGNOSIS — R42 Dizziness and giddiness: Secondary | ICD-10-CM | POA: Diagnosis not present

## 2015-11-03 DIAGNOSIS — N183 Chronic kidney disease, stage 3 (moderate): Secondary | ICD-10-CM | POA: Diagnosis not present

## 2015-11-03 DIAGNOSIS — M6289 Other specified disorders of muscle: Secondary | ICD-10-CM | POA: Diagnosis not present

## 2015-11-03 DIAGNOSIS — I10 Essential (primary) hypertension: Secondary | ICD-10-CM | POA: Diagnosis not present

## 2015-11-03 LAB — GLUCOSE, CAPILLARY
Glucose-Capillary: 96 mg/dL (ref 65–99)
Glucose-Capillary: 86 mg/dL (ref 65–99)

## 2015-11-03 LAB — HEMOGLOBIN A1C
Hgb A1c MFr Bld: 5.8 % — ABNORMAL HIGH (ref 4.8–5.6)
MEAN PLASMA GLUCOSE: 120 mg/dL

## 2015-11-03 MED ORDER — MECLIZINE HCL 12.5 MG PO TABS
25.0000 mg | ORAL_TABLET | Freq: Three times a day (TID) | ORAL | Status: DC | PRN
Start: 2015-11-03 — End: 2015-11-03

## 2015-11-03 MED ORDER — ASPIRIN 81 MG PO CHEW: 81.0000 mg | CHEWABLE_TABLET | Freq: Every day | ORAL | Status: AC

## 2015-11-03 MED ORDER — MECLIZINE HCL 25 MG PO TABS: 25.0000 mg | ORAL_TABLET | Freq: Three times a day (TID) | ORAL | Status: DC | PRN

## 2015-11-03 MED ORDER — LORATADINE 10 MG PO TABS
10.0000 mg | ORAL_TABLET | Freq: Every day | ORAL | Status: DC
  Administered 2015-11-03: 10 mg via ORAL
  Filled 2015-11-03: qty 1

## 2015-11-03 MED ORDER — LORATADINE 10 MG PO TABS: 10.0000 mg | ORAL_TABLET | Freq: Every day | ORAL | Status: DC

## 2015-11-03 MED ORDER — FENOFIBRATE 54 MG PO TABS: 54.0000 mg | ORAL_TABLET | Freq: Every day | ORAL | Status: DC

## 2015-11-03 NOTE — Progress Notes (Signed)
PT Cancellation Note  Patient Details Name: KAELIN SICKLES MRN: [id number] DOB: 18-Jan-1936   Cancelled Treatment:    Reason Eval/Treat Not Completed: Patient was evaluated 5/14 with recommendations for HHPT. OT evaluation is planned for today. Will await their recommendation if further PT input is required. (MD notified via [facility] text page)   Cherylynn Liszewski 11/03/2015, 8:33 AM Pager [phone]

## 2015-11-03 NOTE — Care Management Obs Status (Signed)
[place] NOTIFICATION   Patient Details  Name: Erin Oliver MRN: [id number] Date of Birth: 10/10/1935   Medicare Observation Status Notification Given:  Yes (MRI results negative)    Pollie Friar, RN 11/03/2015, 10:44 AM

## 2015-11-03 NOTE — Progress Notes (Signed)
Pt for discharge home today. Discharge orders received. IV and telemetry dcd with dressing clean dry and intact. Discharge instructions given, advised that prescriptions called into preferred pharmacy. Education and stroke prevention handouts given given with verbalized understanding. [place]er care arranged through CM. Family at bedside to assist with discharge. Staff brought patient to lobby via wheelchair at 1215. Transported to home by family member.

## 2015-11-03 NOTE — Care Management Note (Signed)
Case Management Note  Patient Details  Name: Erin Oliver MRN: [id number] Date of Birth: [birth year]  Subjective/Objective:                    Action/Plan: Patient discharging home with [facility] services. CM met with the patient and provided her a list of [place] agencies in [place]. She states she has a relative that works at [employer] so she would like to use their [facility] services. CM spoke to Butch Penny at [facility] and she accepted the referral. CM faxed her the information she requested. Will updated the bedside RN.   Expected Discharge Date:                  Expected Discharge Plan:  [place]  In-House Referral:     Discharge planning Services  CM Consult  Post Acute Care Choice:  Home Health Choice offered to:  Patient  DME Arranged:    DME Agency:     HH Arranged:  PT, OT, Nurse's Aide [place] Agency:  [place]  Status of Service:  In process, will continue to follow  Medicare Important Message Given:    Date Medicare IM Given:    Medicare IM give by:    Date Additional Medicare IM Given:    Additional Medicare Important Message give by:     If discussed at [place] of Stay Meetings, dates discussed:    Additional Comments:  Pollie Friar, RN 11/03/2015, 10:55 AM

## 2015-11-03 NOTE — Discharge Planning (Signed)
Erin Oliver, is a 80 y.o. female  DOB Jun 18, 1936  MRN [id number].  Admission date:  11/01/2015  Admitting Physician  Edwin Dada, MD  Discharge Date:  11/03/2015   Primary MD  Janie Morning, DO  Recommendations for primary care physician for things to follow:   Outpatient ENT follow-up, check CBC BMP and A1c next visit   Admission Diagnosis  TIA   Discharge Diagnosis  BPV  Active Problems:   Vertigo   Right sided weakness   Essential hypertension   CKD (chronic kidney disease), stage III      Past Medical History  Diagnosis Date  . Hypertension   . Hypothyroidism     Past Surgical History  Procedure Laterality Date  . Breast surgery    . Abdominal hysterectomy    . Cholecystectomy    . Right knee arthroscopy         HPI  from the history and physical done on the day of admission:    Erin Oliver is a 80 y.o. female with a past medical history significant for HTN who presents with vertigo and blurry vision.  The patient was in her usual state of health until last night 5/13 at 0130 when she got up from bed, felt sudden onset severe vertigo with sitting up, then nausea and NBNB emesis as well as blurry vision spinning vision. She presented to the ER at [facility] where she was noted to have R > L tremor and right leg weakness and blurry vision. CT head was negative, MRI was not available and tPA was not administered (presumably because her vision and right leg weakness were reportedly resolving) but the patient was requested to be transferred to [facility].     Hospital Course:   1. Vertigo. Seems to have resolved. Likely had underlying BPV, Initial suspicion for CVA, MRI brain rules out CVA, TIA workup unremarkable, LDL is 70, echo and carotid ultrasound unremarkable, seen by  neuro cleared for home discharge, will place on antibiotic along with Claritin as her eustachian tubes seem to be blocked, recommend outpatient ENT and neuro follow-up, home PT ordered.  2. Dyslipidemia. LDL 70, triglycerides elevated, continue statin at home dose, added TriCor.  3. Hypertension. Continue home medications which include HCTZ, bisoprolol, ACE inhibitor and Norvasc.  4. GERD. On PPI.     Follow UP  Follow-up Information    Follow up with [name], MD. Schedule an appointment as soon as possible for a visit in 3 days.   Specialty:  Otolaryngology   Contact information:   [address] [place] [address] [phone]       Follow up with [name]. Schedule an appointment as soon as possible for a visit in 1 week.   Contact information:   [address]     [place] [phone]      Follow up with [name], [name], DO. Schedule an appointment as soon as possible for a visit in  3 days.   Specialty:  Family Medicine   Contact information:   [place] [address] [phone]        Consults obtained - Neuro  Discharge Condition: Stable  Diet and Activity recommendation: See Discharge Instructions below  Discharge Instructions       Discharge Instructions    Diet - low sodium heart healthy    Complete by:  As directed      Discharge instructions    Complete by:  As directed   Follow with Primary MD  in 7 days   Get CBC, CMP, 2 view Chest X ray checked  by Primary MD next visit.    Activity: As tolerated with Full fall precautions use walker/cane & assistance as needed   Disposition Home     Diet:   Heart Healthy  .  For Heart failure patients - Check your Weight same time everyday, if you gain over 2 pounds, or you develop in leg swelling, experience more shortness of breath or chest pain, call your Primary MD immediately. Follow Cardiac Low  Salt Diet and 1.5 lit/day fluid restriction.   On your next visit with your primary care physician please Get Medicines reviewed and adjusted.   Please request your Prim.MD to go over all Hospital Tests and Procedure/Radiological results at the follow up, please get all Hospital records sent to your Prim MD by signing hospital release before you go home.   If you experience worsening of your admission symptoms, develop shortness of breath, life threatening emergency, suicidal or homicidal thoughts you must seek medical attention immediately by calling 911 or calling your MD immediately  if symptoms less severe.  You Must read complete instructions/literature along with all the possible adverse reactions/side effects for all the Medicines you take and that have been prescribed to you. Take any new Medicines after you have completely understood and accpet all the possible adverse reactions/side effects.   Do not drive, operating heavy machinery, perform activities at heights, swimming or participation in water activities or provide baby sitting services if your were admitted for syncope or siezures until you have seen by Primary MD or a Neurologist and advised to do so again.  Do not drive when taking Pain medications.    Do not take more than prescribed Pain, Sleep and Anxiety Medications  Special Instructions: If you have smoked or chewed Tobacco  in the last 2 yrs please stop smoking, stop any regular Alcohol  and or any Recreational drug use.  Wear Seat belts while driving.   Please note  You were cared for by a hospitalist during your hospital stay. If you have any questions about your discharge medications or the care you received while you were in the hospital after you are discharged, you can call the unit and asked to speak with the hospitalist on call if the hospitalist that took care of you is not available. Once you are discharged, your primary care physician will handle any  further medical issues. Please note that NO REFILLS for any discharge medications will be authorized once you are discharged, as it is imperative that you return to your primary care physician (or establish a relationship with a primary care physician if you do not have one) for your aftercare needs so that they can reassess your need for medications and monitor your lab values.     Increase activity slowly    Complete by:  As directed  Discharge Medications       Medication List    TAKE these medications        amLODipine 10 MG tablet  Commonly known as:  NORVASC  Take 10 mg by mouth daily.     aspirin 81 MG chewable tablet  Chew 1 tablet (81 mg total) by mouth daily.     benazepril 40 MG tablet  Commonly known as:  LOTENSIN  Take 40 mg by mouth daily.     bisoprolol-hydrochlorothiazide 10-6.25 MG tablet  Commonly known as:  ZIAC  Take 1 tablet by mouth daily.     calcium carbonate 1500 (600 Ca) MG Tabs tablet  Commonly known as:  OSCAL  Take 1,500 mg by mouth 2 (two) times daily with a meal.     cholecalciferol 1000 units tablet  Commonly known as:  VITAMIN D  Take 1,000 Units by mouth daily.     fenofibrate 54 MG tablet  Take 1 tablet (54 mg total) by mouth daily.     fluticasone 50 MCG/ACT nasal spray  Commonly known as:  FLONASE  Place 1 spray into both nostrils 2 (two) times daily.     loratadine 10 MG tablet  Commonly known as:  CLARITIN  Take 1 tablet (10 mg total) by mouth daily.     lovastatin 40 MG tablet  Commonly known as:  MEVACOR  Take 40 mg by mouth at bedtime.     meclizine 25 MG tablet  Commonly known as:  ANTIVERT  Take 1 tablet (25 mg total) by mouth 3 (three) times daily as needed for dizziness.     omeprazole 20 MG capsule  Commonly known as:  PRILOSEC  Take 20 mg by mouth daily.     trimethoprim 100 MG tablet  Commonly known as:  TRIMPEX  Take 100 mg by mouth 2 (two) times daily.        Major procedures and  Radiology Reports - PLEASE review detailed and final reports for all details, in brief -   TTE   Left ventricle: The cavity size was normal. Wall thickness was normal. Systolic function was normal. The estimated ejection fraction was in the range of 60% to 65%. Wall motion was normal; there were no regional wall motion abnormalities. - Aortic valve: Valve area (Vmax): 2.36 cm^2. - Mitral valve: There was mild regurgitation.  Carotid US - There is no obvious evidence of hemodynamically significant internal carotid artery stenosis bilaterally. Vertebral arteries are patent with antegrade flow.     Mr Brain Wo Contrast  11/02/2015  CLINICAL DATA:  Personal history of hypertension and hyperlipidemia. Acute onset of vertigo and right-sided weakness. EXAM: MRI HEAD WITHOUT CONTRAST MRA HEAD WITHOUT CONTRAST TECHNIQUE: Multiplanar, multiecho pulse sequences of the brain and surrounding structures were obtained without intravenous contrast. Angiographic images of the head were obtained using MRA technique without contrast. COMPARISON:  None. FINDINGS: MRI HEAD FINDINGS Diffusion imaging does not show any acute or subacute infarction. The brainstem and cerebellum are normal. Within the cerebral hemispheres, there are scattered foci of T2 and FLAIR signal within the deep and subcortical white matter consistent with ordinary small vessel disease, mild to moderate in degree. No cortical or large vessel territory infarction. No mass lesion, hemorrhage, hydrocephalus or extra-axial collection. No pituitary mass. No inflammatory sinus disease. Tiny amount of fluid in the mastoid air cells, probably subclinical. MRA HEAD FINDINGS Both internal carotid arteries are widely patent into the brain. The anterior and middle cerebral vessels are normal  without proximal stenosis, aneurysm or vascular malformation. Fetal origin of both posterior cerebral arteries. 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  Both vertebral arteries are patent to the basilar. No basilar stenosis. Posterior circulation branch vessels are normal. Posterior circulation vessels are somewhat diminutive due to the fetal origin of the PCAs. IMPRESSION: No acute finding. Mild to moderate chronic small-vessel ischemic change of the cerebral hemispheric white matter, fairly typical for age. Negative intracranial MR angiography. No cause of the presenting symptoms is identified. Electronically Signed   By: Nelson Chimes M.D.   On: 11/02/2015 07:11   Mr Lovenia Kim  11/02/2015  CLINICAL DATA:  Personal history of hypertension and hyperlipidemia. Acute onset of vertigo and right-sided weakness. EXAM: MRI HEAD WITHOUT CONTRAST MRA HEAD WITHOUT CONTRAST TECHNIQUE: Multiplanar, multiecho pulse sequences of the brain and surrounding structures were obtained without intravenous contrast. Angiographic images of the head were obtained using MRA technique without contrast. COMPARISON:  None. FINDINGS: MRI HEAD FINDINGS Diffusion imaging does not show any acute or subacute infarction. The brainstem and cerebellum are normal. Within the cerebral hemispheres, there are scattered foci of T2 and FLAIR signal within the deep and subcortical white matter consistent with ordinary small vessel disease, mild to moderate in degree. No cortical or large vessel territory infarction. No mass lesion, hemorrhage, hydrocephalus or extra-axial collection. No pituitary mass. No inflammatory sinus disease. Tiny amount of fluid in the mastoid air cells, probably subclinical. MRA HEAD FINDINGS Both internal carotid arteries are widely patent into the brain. The anterior and middle cerebral vessels are normal without proximal stenosis, aneurysm or vascular malformation. Fetal origin of both posterior cerebral arteries. 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 Both vertebral arteries are patent to the basilar. No basilar stenosis. Posterior circulation branch  vessels are normal. Posterior circulation vessels are somewhat diminutive due to the fetal origin of the PCAs. IMPRESSION: No acute finding. Mild to moderate chronic small-vessel ischemic change of the cerebral hemispheric white matter, fairly typical for age. Negative intracranial MR angiography. No cause of the presenting symptoms is identified. Electronically Signed   By: Nelson Chimes M.D.   On: 11/02/2015 07:11    Micro Results      No results found for this or any previous visit (from the past 240 hour(s)).     Today   Subjective    Hedi Hochberg today has no headache,no chest abdominal pain,no new weakness tingling or numbness, feels much better wants to go home today.     Objective   Blood pressure 147/73, pulse 65, temperature 97.9 F (36.6 C), temperature source Oral, resp. rate 20, height 5\' 4"  (1.626 m), weight 78.79 kg (173 lb 11.2 oz), SpO2 95 %.   Intake/Output Summary (Last 24 hours) at 11/03/15 1003 Last data filed at 11/03/15 0837  Gross per 24 hour  Intake    840 ml  Output      0 ml  Net    840 ml    Exam Awake Alert, Oriented x 3, No new F.N deficits, Normal affect [place].AT,PERRAL Supple Neck,No JVD, No cervical lymphadenopathy appriciated.  Symmetrical Chest wall movement, Good air movement bilaterally, CTAB RRR,No Gallops,Rubs or new Murmurs, No Parasternal Heave +ve B.Sounds, Abd Soft, Non tender, No organomegaly appriciated, No rebound -guarding or rigidity. No Cyanosis, Clubbing or edema, No new Rash or bruise   Data Review   CBC w Diff: Lab Results  Component Value Date   WBC 6.4 11/01/2015  HGB 13.1 11/01/2015   HCT 40.8 11/01/2015   PLT 355 11/01/2015    CMP: Lab Results  Component Value Date   NA 140 11/01/2015   K 3.8 11/01/2015   CL 105 11/01/2015   CO2 26 11/01/2015   BUN 19 11/01/2015   CREATININE 1.33* 11/01/2015   PROT 6.9 11/01/2015   ALBUMIN 3.6 11/01/2015   BILITOT 0.6 11/01/2015   ALKPHOS 55 11/01/2015   AST 25  11/01/2015   ALT 20 11/01/2015  .  Lab Results  Component Value Date   CHOL 149 11/02/2015   HDL 36* 11/02/2015   LDLCALC 70 11/02/2015   TRIG 214* 11/02/2015   CHOLHDL 4.1 11/02/2015   No results found for: HGBA1C   Total Time in preparing paper work, data evaluation and todays exam - 35 minutes  Thurnell Lose M.D on 11/03/2015 at 10:03 AM  [facility]   Office  [phone]

## 2015-11-03 NOTE — Discharge Instructions (Signed)

## 2015-11-03 NOTE — Evaluation (Signed)
Occupational Therapy Evaluation Patient Details Name: Erin Oliver MRN: [id number] DOB: 1936-01-03 Today's Date: 11/03/2015    History of Present Illness 80 y.o. who experienced acute onset of vertigo and heaviness involving her right lower extremity with difficulty with ambulation. MRI negative for acute infarct.PMH includes HTN, HLD, hypothyroidism.    Clinical Impression   Education provided in session and plan is for pt do d/c home today. Deferring all further OT needs to next venue of care. Recommended pt get a full visual field assessment and wrote down name of vision test for pt and recommended her not to drive until she gets vision test done and sees results.     Follow Up Recommendations  Home health OT;Supervision/Assistance - 24 hour;Other (comment) (full vision field assessment at eye doctor)    Equipment Recommendations  None recommended by OT    Recommendations for Other Services       Precautions / Restrictions Precautions Precautions: Fall Restrictions Weight Bearing Restrictions: No      Mobility Bed Mobility               General bed mobility comments: not assessed  Transfers Overall transfer level: Needs assistance   Transfers: Sit to/from Stand Sit to Stand: Supervision              Balance  Unsteady with ambulation; difficulty simulating functional task with single leg stance without UE supported.                                           ADL Overall ADL's : Needs assistance/impaired                     Lower Body Dressing: Sit to/from stand;Set up;Supervision/safety   Toilet Transfer: Min guard;Ambulation (sit to stand from chair)           Functional mobility during ADLs: Min guard General ADL Comments: Educated on safety such as rugs/items on floor and sitting for LB dressing. Discussed briefly use of RW.     Vision Reports no change from baseline at this time Vision Assessment?: Yes Visual  Fields: Other (comment) (inconsistent in left upper quadrant) Additional Comments: explained to look up on left side to compensate for apparent left field deficit.    Perception     Praxis      Pertinent Vitals/Pain Pain Assessment: No/denies pain     Hand Dominance     Extremity/Trunk Assessment Upper Extremity Assessment Upper Extremity Assessment: Generalized weakness;LUE deficits/detail (decreased strength in bilateral shoulder flexors) LUE Deficits / Details: decreased strength in shoulder flexors LUE Coordination: decreased gross motor   Lower Extremity Assessment Lower Extremity Assessment: Defer to PT evaluation       Communication Communication Communication: No difficulties   Cognition Arousal/Alertness: Awake/alert Behavior During Therapy: WFL for tasks assessed/performed Overall Cognitive Status: No family/caregiver present to determine baseline cognitive functioning                     General Comments       Exercises Exercises: Other exercises Other Exercises Other Exercises: educated on activities she could do for gross motor coordination.    Shoulder Instructions      Home Living Family/patient expects to be discharged to:: Private residence Living Arrangements: Spouse/significant other Available Help at Discharge: Family;Available PRN/intermittently (pt assists spouse) Type of Home: House Home Access:  Level entry     Home Layout: One level     Bathroom Shower/Tub: Tub/shower unit         Home Equipment: Walker - 2 wheels;Shower seat (reported she had a cane)          Prior Functioning/Environment Level of Independence: Independent        Comments: does work outside as well    OT Diagnosis: Generalized weakness   OT Problem List: Decreased range of motion;Decreased strength;Decreased coordination;Impaired vision/perception;Impaired balance (sitting and/or standing);Decreased activity tolerance;Decreased safety awareness    OT Treatment/Interventions:      OT Goals(Current goals can be found in the care plan section)    OT Frequency:     Barriers to D/C:            Co-evaluation              End of Session Nurse Communication: Other (comment) (pt not agreeable to sit in chait with alarm)  Activity Tolerance: Patient tolerated treatment well Patient left: with call bell/phone within reach;Other (comment) (on couch-pt not agreeable to sit in chair with alarm)   Time: [id number] OT Time Calculation (min): 20 min Charges:  OT General Charges $OT Visit: 1 Procedure OT Evaluation $OT Eval Moderate Complexity: 1 Procedure G-Codes: OT G-codes **NOT FOR INPATIENT CLASS** Functional Assessment Tool Used: clinical judgment Functional Limitation: Self care Self Care Current Status [id number]): At least 1 percent but less than 20 percent impaired, limited or restricted Self Care Goal Status [id number]): At least 1 percent but less than 20 percent impaired, limited or restricted Self Care Discharge Status [phone]): At least 1 percent but less than 20 percent impaired, limited or restricted  Benito Mccreedy OTR/L [id number] 11/03/2015, 10:30 AM

## 2015-11-04 NOTE — Discharge Summary (Signed)
Erin Oliver, is a 80 y.o. female  DOB 05-15-1936  MRN [id number].  Admission date:  11/01/2015  Admitting Physician  Edwin Dada, MD  Discharge Date:  11/04/2015   Primary MD  Janie Morning, DO  Recommendations for primary care physician for things to follow:   Outpatient ENT follow-up, check CBC BMP and A1c next visit   Admission Diagnosis  TIA   Discharge Diagnosis  BPV  Active Problems:   Vertigo   Right sided weakness   Essential hypertension   CKD (chronic kidney disease), stage III      Past Medical History  Diagnosis Date  . Hypertension   . Hypothyroidism     Past Surgical History  Procedure Laterality Date  . Breast surgery    . Abdominal hysterectomy    . Cholecystectomy    . Right knee arthroscopy         HPI  from the history and physical done on the day of admission:    Erin Oliver is a 80 y.o. female with a past medical history significant for HTN who presents with vertigo and blurry vision.  The patient was in her usual state of health until last night 5/13 at 0130 when she got up from bed, felt sudden onset severe vertigo with sitting up, then nausea and NBNB emesis as well as blurry vision spinning vision. She presented to the ER at [facility] where she was noted to have R > L tremor and right leg weakness and blurry vision. CT head was negative, MRI was not available and tPA was not administered (presumably because her vision and right leg weakness were reportedly resolving) but the patient was requested to be transferred to [facility].     Hospital Course:   1. Vertigo. Seems to have resolved. Likely had underlying BPV, Initial suspicion for CVA, MRI brain rules out CVA, TIA workup unremarkable, LDL is 70, echo and carotid ultrasound unremarkable, seen by  neuro cleared for home discharge, will place on antibiotic along with Claritin as her eustachian tubes seem to be blocked, recommend outpatient ENT and neuro follow-up, home PT ordered.  2. Dyslipidemia. LDL 70, triglycerides elevated, continue statin at home dose, added TriCor.  3. Hypertension. Continue home medications which include HCTZ, bisoprolol, ACE inhibitor and Norvasc.  4. GERD. On PPI.     Follow UP  Follow-up Information    Follow up with [name], MD. Schedule an appointment as soon as possible for a visit in 3 days.   Specialty:  Otolaryngology   Contact information:   [address] [place] [address] [phone]       Follow up with [place]. Schedule an appointment as soon as possible for a visit in 1 week.   Contact information:   [address]     [place] [phone]      Follow up with [name], [name], DO. Schedule an appointment as soon as possible for a visit in  3 days.   Specialty:  Family Medicine   Contact information:   [place] [address] [phone]        Consults obtained - Neuro  Discharge Condition: Stable  Diet and Activity recommendation: See Discharge Instructions below  Discharge Instructions           Discharge Instructions    Diet - low sodium heart healthy    Complete by:  As directed      Discharge instructions    Complete by:  As directed   Follow with Primary MD  in 7 days   Get CBC, CMP, 2 view Chest X ray checked  by Primary MD next visit.    Activity: As tolerated with Full fall precautions use walker/cane & assistance as needed   Disposition Home     Diet:   Heart Healthy  .  For Heart failure patients - Check your Weight same time everyday, if you gain over 2 pounds, or you develop in leg swelling, experience more shortness of breath or chest pain, call your Primary MD immediately. Follow  Cardiac Low Salt Diet and 1.5 lit/day fluid restriction.   On your next visit with your primary care physician please Get Medicines reviewed and adjusted.   Please request your Prim.MD to go over all Hospital Tests and Procedure/Radiological results at the follow up, please get all Hospital records sent to your Prim MD by signing hospital release before you go home.   If you experience worsening of your admission symptoms, develop shortness of breath, life threatening emergency, suicidal or homicidal thoughts you must seek medical attention immediately by calling 911 or calling your MD immediately  if symptoms less severe.  You Must read complete instructions/literature along with all the possible adverse reactions/side effects for all the Medicines you take and that have been prescribed to you. Take any new Medicines after you have completely understood and accpet all the possible adverse reactions/side effects.   Do not drive, operating heavy machinery, perform activities at heights, swimming or participation in water activities or provide baby sitting services if your were admitted for syncope or siezures until you have seen by Primary MD or a Neurologist and advised to do so again.  Do not drive when taking Pain medications.    Do not take more than prescribed Pain, Sleep and Anxiety Medications  Special Instructions: If you have smoked or chewed Tobacco  in the last 2 yrs please stop smoking, stop any regular Alcohol  and or any Recreational drug use.  Wear Seat belts while driving.   Please note  You were cared for by a hospitalist during your hospital stay. If you have any questions about your discharge medications or the care you received while you were in the hospital after you are discharged, you can call the unit and asked to speak with the hospitalist on call if the hospitalist that took care of you is not available. Once you are discharged, your primary care physician will  handle any further medical issues. Please note that NO REFILLS for any discharge medications will be authorized once you are discharged, as it is imperative that you return to your primary care physician (or establish a relationship with a primary care physician if you do not have one) for your aftercare needs so that they can reassess your need for medications and monitor your lab values.     Increase activity slowly    Complete by:  As directed  Discharge Medications       Medication List    TAKE these medications        amLODipine 10 MG tablet  Commonly known as:  NORVASC  Take 10 mg by mouth daily.     aspirin 81 MG chewable tablet  Chew 1 tablet (81 mg total) by mouth daily.     benazepril 40 MG tablet  Commonly known as:  LOTENSIN  Take 40 mg by mouth daily.     bisoprolol-hydrochlorothiazide 10-6.25 MG tablet  Commonly known as:  ZIAC  Take 1 tablet by mouth daily.     calcium carbonate 1500 (600 Ca) MG Tabs tablet  Commonly known as:  OSCAL  Take 1,500 mg by mouth 2 (two) times daily with a meal.     cholecalciferol 1000 units tablet  Commonly known as:  VITAMIN D  Take 1,000 Units by mouth daily.     fenofibrate 54 MG tablet  Take 1 tablet (54 mg total) by mouth daily.     fluticasone 50 MCG/ACT nasal spray  Commonly known as:  FLONASE  Place 1 spray into both nostrils 2 (two) times daily.     loratadine 10 MG tablet  Commonly known as:  CLARITIN  Take 1 tablet (10 mg total) by mouth daily.     lovastatin 40 MG tablet  Commonly known as:  MEVACOR  Take 40 mg by mouth at bedtime.     meclizine 25 MG tablet  Commonly known as:  ANTIVERT  Take 1 tablet (25 mg total) by mouth 3 (three) times daily as needed for dizziness.     omeprazole 20 MG capsule  Commonly known as:  PRILOSEC  Take 20 mg by mouth daily.     trimethoprim 100 MG tablet  Commonly known as:  TRIMPEX  Take 100 mg by mouth 2 (two) times daily.        Major  procedures and Radiology Reports - PLEASE review detailed and final reports for all details, in brief -   TTE   Left ventricle: The cavity size was normal. Wall thickness was normal. Systolic function was normal. The estimated ejection fraction was in the range of 60% to 65%. Wall motion was normal; there were no regional wall motion abnormalities. - Aortic valve: Valve area (Vmax): 2.36 cm^2. - Mitral valve: There was mild regurgitation.  Carotid US - There is no obvious evidence of hemodynamically significant internal carotid artery stenosis bilaterally. Vertebral arteries are patent with antegrade flow.     Mr Brain Wo Contrast  11/02/2015  CLINICAL DATA:  Personal history of hypertension and hyperlipidemia. Acute onset of vertigo and right-sided weakness. EXAM: MRI HEAD WITHOUT CONTRAST MRA HEAD WITHOUT CONTRAST TECHNIQUE: Multiplanar, multiecho pulse sequences of the brain and surrounding structures were obtained without intravenous contrast. Angiographic images of the head were obtained using MRA technique without contrast. COMPARISON:  None. FINDINGS: MRI HEAD FINDINGS Diffusion imaging does not show any acute or subacute infarction. The brainstem and cerebellum are normal. Within the cerebral hemispheres, there are scattered foci of T2 and FLAIR signal within the deep and subcortical white matter consistent with ordinary small vessel disease, mild to moderate in degree. No cortical or large vessel territory infarction. No mass lesion, hemorrhage, hydrocephalus or extra-axial collection. No pituitary mass. No inflammatory sinus disease. Tiny amount of fluid in the mastoid air cells, probably subclinical. MRA HEAD FINDINGS Both internal carotid arteries are widely patent into the brain. The anterior and middle cerebral vessels are normal  without proximal stenosis, aneurysm or vascular malformation. Fetal origin of both posterior cerebral arteries. 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 Both vertebral arteries are patent to the basilar. No basilar stenosis. Posterior circulation branch vessels are normal. Posterior circulation vessels are somewhat diminutive due to the fetal origin of the PCAs. IMPRESSION: No acute finding. Mild to moderate chronic small-vessel ischemic change of the cerebral hemispheric white matter, fairly typical for age. Negative intracranial MR angiography. No cause of the presenting symptoms is identified. Electronically Signed   By: Nelson Chimes M.D.   On: 11/02/2015 07:11   Mr Lovenia Kim  11/02/2015  CLINICAL DATA:  Personal history of hypertension and hyperlipidemia. Acute onset of vertigo and right-sided weakness. EXAM: MRI HEAD WITHOUT CONTRAST MRA HEAD WITHOUT CONTRAST TECHNIQUE: Multiplanar, multiecho pulse sequences of the brain and surrounding structures were obtained without intravenous contrast. Angiographic images of the head were obtained using MRA technique without contrast. COMPARISON:  None. FINDINGS: MRI HEAD FINDINGS Diffusion imaging does not show any acute or subacute infarction. The brainstem and cerebellum are normal. Within the cerebral hemispheres, there are scattered foci of T2 and FLAIR signal within the deep and subcortical white matter consistent with ordinary small vessel disease, mild to moderate in degree. No cortical or large vessel territory infarction. No mass lesion, hemorrhage, hydrocephalus or extra-axial collection. No pituitary mass. No inflammatory sinus disease. Tiny amount of fluid in the mastoid air cells, probably subclinical. MRA HEAD FINDINGS Both internal carotid arteries are widely patent into the brain. The anterior and middle cerebral vessels are normal without proximal stenosis, aneurysm or vascular malformation. Fetal origin of both posterior cerebral arteries. 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 Both vertebral arteries are patent to the basilar. No basilar stenosis. Posterior  circulation branch vessels are normal. Posterior circulation vessels are somewhat diminutive due to the fetal origin of the PCAs. IMPRESSION: No acute finding. Mild to moderate chronic small-vessel ischemic change of the cerebral hemispheric white matter, fairly typical for age. Negative intracranial MR angiography. No cause of the presenting symptoms is identified. Electronically Signed   By: Nelson Chimes M.D.   On: 11/02/2015 07:11    Micro Results      No results found for this or any previous visit (from the past 240 hour(s)).     Today   Subjective    Carolinda Ratajczak today has no headache,no chest abdominal pain,no new weakness tingling or numbness, feels much better wants to go home today.     Objective   Blood pressure 126/64, pulse 89, temperature 97.7 F (36.5 C), temperature source Oral, resp. rate 19, height 5\' 4"  (1.626 m), weight 78.79 kg (173 lb 11.2 oz), SpO2 96 %.   Intake/Output Summary (Last 24 hours) at 11/04/15 0618 Last data filed at 11/03/15 0837  Gross per 24 hour  Intake    360 ml  Output      0 ml  Net    360 ml    Exam Awake Alert, Oriented x 3, No new F.N deficits, Normal affect [place].AT,PERRAL Supple Neck,No JVD, No cervical lymphadenopathy appriciated.  Symmetrical Chest wall movement, Good air movement bilaterally, CTAB RRR,No Gallops,Rubs or new Murmurs, No Parasternal Heave +ve B.Sounds, Abd Soft, Non tender, No organomegaly appriciated, No rebound -guarding or rigidity. No Cyanosis, Clubbing or edema, No new Rash or bruise   Data Review   CBC w Diff:  Lab Results  Component Value Date   WBC 6.4 11/01/2015  HGB 13.1 11/01/2015   HCT 40.8 11/01/2015   PLT 355 11/01/2015    CMP:  Lab Results  Component Value Date   NA 140 11/01/2015   K 3.8 11/01/2015   CL 105 11/01/2015   CO2 26 11/01/2015   BUN 19 11/01/2015   CREATININE 1.33* 11/01/2015   PROT 6.9 11/01/2015   ALBUMIN 3.6 11/01/2015   BILITOT 0.6 11/01/2015   ALKPHOS 55  11/01/2015   AST 25 11/01/2015   ALT 20 11/01/2015  .  Lab Results  Component Value Date   CHOL 149 11/02/2015   HDL 36* 11/02/2015   LDLCALC 70 11/02/2015   TRIG 214* 11/02/2015   CHOLHDL 4.1 11/02/2015   Lab Results  Component Value Date   HGBA1C 5.8* 11/02/2015     Total Time in preparing paper work, data evaluation and todays exam - 35 minutes  Lala Lund K M.D on 11/04/2015 at 6:18 AM  [facility]   Office  [phone]

## 2016-08-03 DIAGNOSIS — C50411 Malignant neoplasm of upper-outer quadrant of right female breast: Secondary | ICD-10-CM | POA: Insufficient documentation

## 2016-08-20 DIAGNOSIS — C50911 Malignant neoplasm of unspecified site of right female breast: Secondary | ICD-10-CM | POA: Diagnosis not present

## 2016-08-21 DIAGNOSIS — C50911 Malignant neoplasm of unspecified site of right female breast: Secondary | ICD-10-CM | POA: Diagnosis not present

## 2016-09-13 DIAGNOSIS — Z171 Estrogen receptor negative status [ER-]: Secondary | ICD-10-CM | POA: Diagnosis not present

## 2016-09-13 DIAGNOSIS — C50911 Malignant neoplasm of unspecified site of right female breast: Secondary | ICD-10-CM | POA: Diagnosis not present

## 2017-05-27 DIAGNOSIS — R918 Other nonspecific abnormal finding of lung field: Secondary | ICD-10-CM | POA: Diagnosis not present

## 2017-05-27 DIAGNOSIS — N644 Mastodynia: Secondary | ICD-10-CM

## 2017-05-27 DIAGNOSIS — C50911 Malignant neoplasm of unspecified site of right female breast: Secondary | ICD-10-CM | POA: Diagnosis not present

## 2017-05-27 DIAGNOSIS — Z171 Estrogen receptor negative status [ER-]: Secondary | ICD-10-CM

## 2017-05-27 DIAGNOSIS — Z923 Personal history of irradiation: Secondary | ICD-10-CM

## 2017-08-26 DIAGNOSIS — M8580 Other specified disorders of bone density and structure, unspecified site: Secondary | ICD-10-CM | POA: Diagnosis not present

## 2017-08-26 DIAGNOSIS — Z7983 Long term (current) use of bisphosphonates: Secondary | ICD-10-CM | POA: Diagnosis not present

## 2017-08-26 DIAGNOSIS — Z171 Estrogen receptor negative status [ER-]: Secondary | ICD-10-CM | POA: Diagnosis not present

## 2017-08-26 DIAGNOSIS — C50911 Malignant neoplasm of unspecified site of right female breast: Secondary | ICD-10-CM | POA: Diagnosis not present

## 2018-02-27 DIAGNOSIS — M8580 Other specified disorders of bone density and structure, unspecified site: Secondary | ICD-10-CM | POA: Diagnosis not present

## 2018-02-27 DIAGNOSIS — Z853 Personal history of malignant neoplasm of breast: Secondary | ICD-10-CM | POA: Diagnosis not present

## 2018-02-27 DIAGNOSIS — Z923 Personal history of irradiation: Secondary | ICD-10-CM | POA: Diagnosis not present

## 2018-09-01 DIAGNOSIS — S22000A Wedge compression fracture of unspecified thoracic vertebra, initial encounter for closed fracture: Secondary | ICD-10-CM | POA: Insufficient documentation

## 2018-10-27 DIAGNOSIS — Z853 Personal history of malignant neoplasm of breast: Secondary | ICD-10-CM

## 2019-01-12 ENCOUNTER — Other Ambulatory Visit: Payer: Self-pay | Admitting: Neurosurgery

## 2019-01-12 ENCOUNTER — Telehealth: Payer: Self-pay

## 2019-01-12 DIAGNOSIS — M5431 Sciatica, right side: Secondary | ICD-10-CM

## 2019-01-12 NOTE — Telephone Encounter (Signed)
Phone call to patient to verify medication list and allergies for myelogram procedure. All medications pt reported taking at this time are safe to continue. Pt verbalized understanding.

## 2019-01-25 NOTE — Discharge Instructions (Signed)

## 2019-01-26 ENCOUNTER — Other Ambulatory Visit: Payer: Self-pay

## 2019-01-26 ENCOUNTER — Ambulatory Visit
Admission: RE | Admit: 2019-01-26 | Discharge: 2019-01-26 | Disposition: A | Discharge status: Home / Self Care | Payer: Medicare HMO | Source: Ambulatory Visit | Attending: Neurosurgery | Admitting: Neurosurgery

## 2019-01-26 DIAGNOSIS — M5431 Sciatica, right side: Secondary | ICD-10-CM

## 2019-01-26 MED ORDER — IOPAMIDOL (ISOVUE-M 200) INJECTION 41%
15.0000 mL | Freq: Once | INTRAMUSCULAR | Status: AC
  Administered 2019-01-26: 15 mL via INTRATHECAL

## 2019-01-26 MED ORDER — ONDANSETRON HCL 4 MG/2ML IJ SOLN
4.0000 mg | Freq: Once | INTRAMUSCULAR | Status: AC
  Administered 2019-01-26: 4 mg via INTRAMUSCULAR

## 2019-01-26 MED ORDER — DIAZEPAM 5 MG PO TABS
5.0000 mg | ORAL_TABLET | Freq: Once | ORAL | Status: AC
  Administered 2019-01-26: 5 mg via ORAL

## 2019-01-26 MED ORDER — MEPERIDINE HCL 50 MG/ML IJ SOLN
50.0000 mg | Freq: Once | INTRAMUSCULAR | Status: AC
  Administered 2019-01-26: 50 mg via INTRAMUSCULAR

## 2019-02-02 DIAGNOSIS — I82409 Acute embolism and thrombosis of unspecified deep veins of unspecified lower extremity: Secondary | ICD-10-CM | POA: Insufficient documentation

## 2019-03-01 DIAGNOSIS — C50411 Malignant neoplasm of upper-outer quadrant of right female breast: Secondary | ICD-10-CM

## 2019-06-11 ENCOUNTER — Encounter: Payer: Self-pay | Admitting: Gastroenterology

## 2019-07-10 ENCOUNTER — Encounter: Payer: Self-pay | Admitting: Gastroenterology

## 2019-07-31 ENCOUNTER — Other Ambulatory Visit: Payer: Self-pay

## 2019-07-31 ENCOUNTER — Telehealth (INDEPENDENT_AMBULATORY_CARE_PROVIDER_SITE_OTHER): Payer: Medicare HMO | Admitting: Gastroenterology

## 2019-07-31 DIAGNOSIS — Z86010 Personal history of colonic polyps: Secondary | ICD-10-CM

## 2019-07-31 DIAGNOSIS — Z1211 Encounter for screening for malignant neoplasm of colon: Secondary | ICD-10-CM

## 2019-07-31 DIAGNOSIS — Z1212 Encounter for screening for malignant neoplasm of rectum: Secondary | ICD-10-CM

## 2019-07-31 DIAGNOSIS — Z853 Personal history of malignant neoplasm of breast: Secondary | ICD-10-CM

## 2019-07-31 DIAGNOSIS — Z87891 Personal history of nicotine dependence: Secondary | ICD-10-CM

## 2019-07-31 NOTE — Progress Notes (Signed)
Chief Complaint:   Referring Provider:  Janie Morning, DO      ASSESSMENT AND PLAN;   #1.  Colorectal cancer screening  #2. H/O colonic polyp s/p polypectomy August 2008  Plan: -Obtain records from Dr. Hinton Rao (last note, labs) -Then would determine regarding colonoscopy.  Patient not very keen on getting it done.  I have reviewed Dr. Remi Deter note.  I have discussed risks and benefits of colonoscopy with the patient.  She would like to hold off on colonoscopy.  She is willing to get Cologuard screening test with the understanding that if Cologuard is positive, she would need colonoscopy.  We plan to proceed with Cologuard.  Will forward this to [place].  HPI:    Erin Oliver is a 84 y.o. female  No records available at the present time History of breast cancer, followed by Dr. Hinton Rao Had a fall which started in vertebral fracture.  Had x-rays done.  Was told by Dr. Hinton Rao that she would need colonoscopy. Apparently had x-ray " showed some abnormality"   No nausea, vomiting, heartburn, regurgitation, odynophagia or dysphagia.  No significant diarrhea or constipation.  No melena or hematochezia. No unintentional weight loss. No abdominal pain.  Has been taken off Eliquis (H/O DVT) for a month now. " Clots have dissolved"   I have reviewed Dr. Remi Deter note from 07/02/2019.   -Briefly, 84 year old with stage IA right breast cancer s/p lumpectomy followed by XRT.  Also has osteopenia with new compression fracture at T10 on CT Abdo March 2020.  She was advised colonoscopy.  Patient would like to hold off on colonoscopy at the present time.  Past GI procedures: -Colonoscopy 01/2005 (PCF, difficult): 4 mm polyp s/p polypectomy, moderate sigmoid diverticulosis, internal hemorrhoids. Past Medical History:  Diagnosis Date  . Hypertension   . Hypothyroidism     Past Surgical History:  Procedure Laterality Date  . ABDOMINAL HYSTERECTOMY    . BREAST SURGERY    .  CHOLECYSTECTOMY    . COLONOSCOPY  01/28/2005   Diminutive colonic polyp status post polypectomy. Moderate sigmoid diverticulosis. Internal hemorrhoids  . right knee arthroscopy      Family History  Problem Relation Age of Onset  . Leukemia Mother   . Congestive Heart Failure Mother   . Lung cancer Sister   . Heart disease Brother   . Colon cancer Neg Hx     Social History   Tobacco Use  . Smoking status: Former Smoker    Types: Cigarettes  . Smokeless tobacco: Current User  Substance Use Topics  . Alcohol use: No  . Drug use: Not on file    Current Outpatient Medications  Medication Sig Dispense Refill  . amLODipine (NORVASC) 10 MG tablet Take 10 mg by mouth daily.    Marland Kitchen aspirin 81 MG chewable tablet Chew 1 tablet (81 mg total) by mouth daily. 30 tablet 0  . benazepril (LOTENSIN) 40 MG tablet Take 40 mg by mouth daily.    . bisoprolol-hydrochlorothiazide (ZIAC) 10-6.25 MG tablet Take 1 tablet by mouth daily.    . calcium carbonate (OSCAL) 1500 (600 Ca) MG TABS tablet Take 1,500 mg by mouth 2 (two) times daily with a meal.    . cholecalciferol (VITAMIN D) 1000 units tablet Take 1,000 Units by mouth daily.    . fenofibrate 54 MG tablet Take 1 tablet (54 mg total) by mouth daily. 30 tablet 0  . fluticasone (FLONASE) 50 MCG/ACT nasal spray Place 1 spray into both nostrils  2 (two) times daily.    Marland Kitchen lovastatin (MEVACOR) 40 MG tablet Take 40 mg by mouth at bedtime.    . meclizine (ANTIVERT) 25 MG tablet Take 1 tablet (25 mg total) by mouth 3 (three) times daily as needed for dizziness. 30 tablet 0  . omeprazole (PRILOSEC) 20 MG capsule Take 20 mg by mouth daily.     No current facility-administered medications for this visit.    Allergies  Allergen Reactions  . Penicillins Hives    Has patient had a PCN reaction causing immediate rash, facial/tongue/throat swelling, SOB or lightheadedness with hypotension: Yes Has patient had a PCN reaction causing severe rash involving mucus  membranes or skin necrosis: No Has patient had a PCN reaction that required hospitalization No Has patient had a PCN reaction occurring within the last 10 years: No If all of the above answers are "NO", then may proceed with Cephalosporin use.     Review of Systems:  Constitutional: Denies fever, chills, diaphoresis, appetite change and fatigue.  HEENT: Denies photophobia, eye pain, redness, hearing loss, ear pain, congestion, sore throat, rhinorrhea, sneezing, mouth sores, neck pain, neck stiffness and tinnitus.   Respiratory: Denies SOB, DOE, cough, chest tightness,  and wheezing.   Cardiovascular: Denies chest pain, palpitations and leg swelling.  Genitourinary: Denies dysuria, urgency, frequency, hematuria, flank pain and difficulty urinating.  Musculoskeletal: Denies myalgias, back pain, joint swelling, arthralgias and gait problem.  Skin: No rash.  Neurological: Denies dizziness, seizures, syncope, weakness, light-headedness, numbness and headaches.  Hematological: Denies adenopathy. Easy bruising, personal or family bleeding history  Psychiatric/Behavioral: No anxiety or depression     Physical Exam:    There were no vitals taken for this visit. Wt Readings from Last 3 Encounters:  07/30/19 174 lb (78.9 kg)  11/01/15 173 lb 11.2 oz (78.8 kg)   Constitutional:  Well-developed, in no acute distress. Psychiatric: Normal mood and affect. Behavior is normal. HEENT: Pupils normal.  Conjunctivae are normal. No scleral icterus. Neck supple.  Cardiovascular: Normal rate, regular rhythm. No edema Pulmonary/chest: Effort normal and breath sounds normal. No wheezing, rales or rhonchi. Abdominal: Soft, nondistended. Nontender. Bowel sounds active throughout. There are no masses palpable. No hepatomegaly. Rectal:  defered Neurological: Alert and oriented to person place and time. Skin: Skin is warm and dry. No rashes noted.  Data Reviewed: I have personally reviewed following labs and  imaging studies  CBC: CBC Latest Ref Rng & Units 11/01/2015  WBC 4.0 - 10.5 K/uL 6.4  Hemoglobin 12.0 - 15.0 g/dL 13.1  Hematocrit 36.0 - 46.0 % 40.8  Platelets 150 - 400 K/uL 355    CMP: CMP Latest Ref Rng & Units 11/01/2015  Glucose 65 - 99 mg/dL 92  BUN 6 - 20 mg/dL 19  Creatinine 0.44 - 1.00 mg/dL 1.33(H)  Sodium 135 - 145 mmol/L 140  Potassium 3.5 - 5.1 mmol/L 3.8  Chloride 101 - 111 mmol/L 105  CO2 22 - 32 mmol/L 26  Calcium 8.9 - 10.3 mg/dL 9.1  Total Protein 6.5 - 8.1 g/dL 6.9  Total Bilirubin 0.3 - 1.2 mg/dL 0.6  Alkaline Phos 38 - 126 U/L 55  AST 15 - 41 U/L 25  ALT 14 - 54 U/L 20     Carmell Austria, MD 07/31/2019, 10:55 AM  Cc: Janie Morning, DO

## 2019-07-31 NOTE — Patient Instructions (Signed)
If you are age 82 or older, your body mass index should be between 23-30. Your There is no height or weight on file to calculate BMI. If this is out of the aforementioned range listed, please consider follow up with your Primary Care Provider.  If you are age 12 or younger, your body mass index should be between 19-25. Your There is no height or weight on file to calculate BMI. If this is out of the aformentioned range listed, please consider follow up with your Primary Care Provider.   Thank you,  Dr. Jackquline Denmark

## 2019-10-31 DIAGNOSIS — C50411 Malignant neoplasm of upper-outer quadrant of right female breast: Secondary | ICD-10-CM

## 2020-02-27 IMAGING — XA DG MYELOGRAPHY LUMBAR INJ LUMBOSACRAL
13 of 14 series · 13 of 14 positions shown · non-contrast
Comparison: MRI of the lumbar spine 12/21/2018 and [REDACTED]
MRI.

CLINICAL DATA: Low back pain extending into the right lower
extremity. Right L5 radiculopathy.
TECHNIQUE: Contiguous axial images were obtained through the Lumbar spine after
the intrathecal infusion of infusion. Coronal and sagittal
reconstructions were obtained of the axial image sets.

[Series 1: vasc adipose · 1 of 1 slices shown (1 of 13)]
[im 1/1]
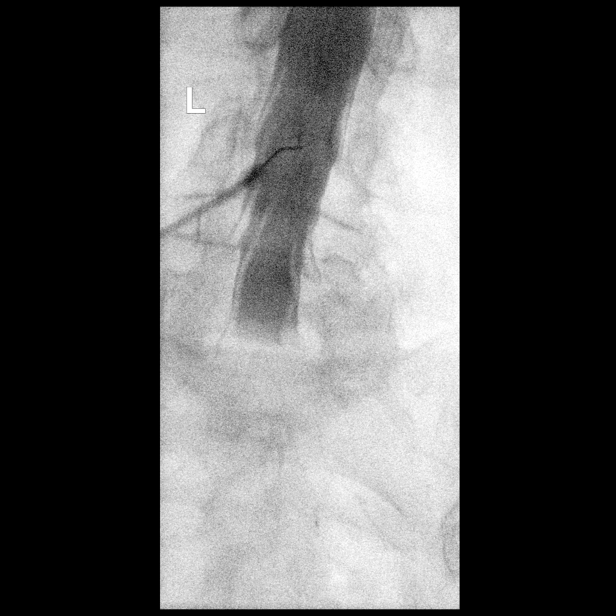

[Series 2: vasc adipose · 1 of 1 slices shown (2 of 13)]
[im 1/1]
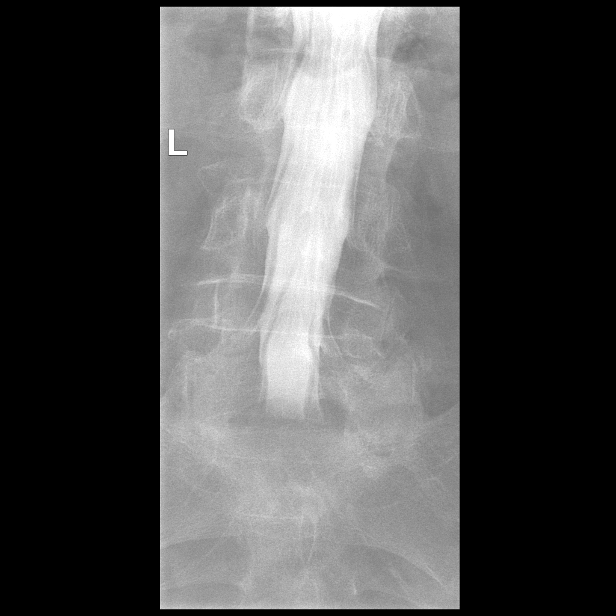

[Series 3: vasc adipose · 1 of 1 slices shown (3 of 13)]
[im 1/1]
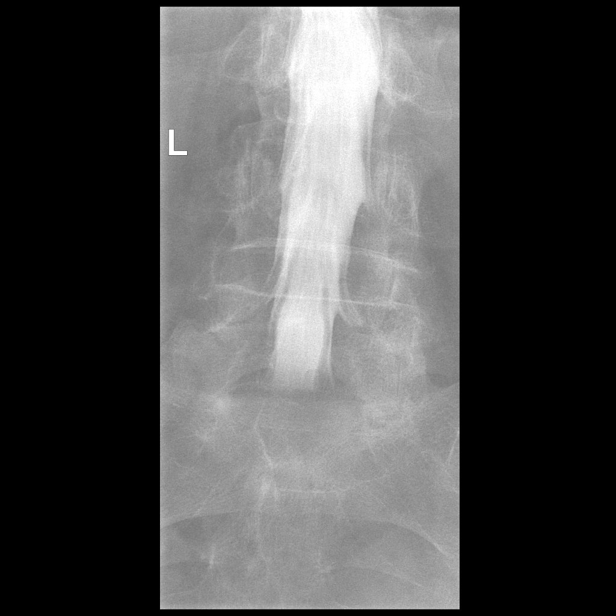

[Series 4: vasc adipose · 1 of 1 slices shown (4 of 13)]
[im 1/1]
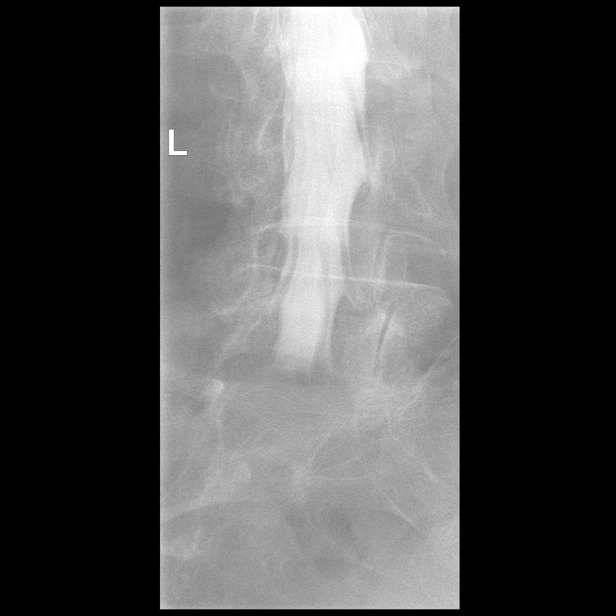

[Series 5: vasc adipose · 1 of 1 slices shown (5 of 13)]
[im 1/1]
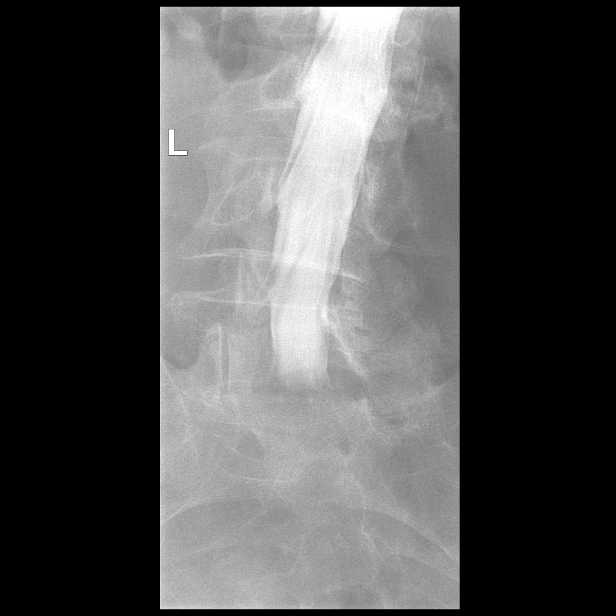

[Series 6: vasc adipose · 1 of 1 slices shown (6 of 13)]
[im 1/1]
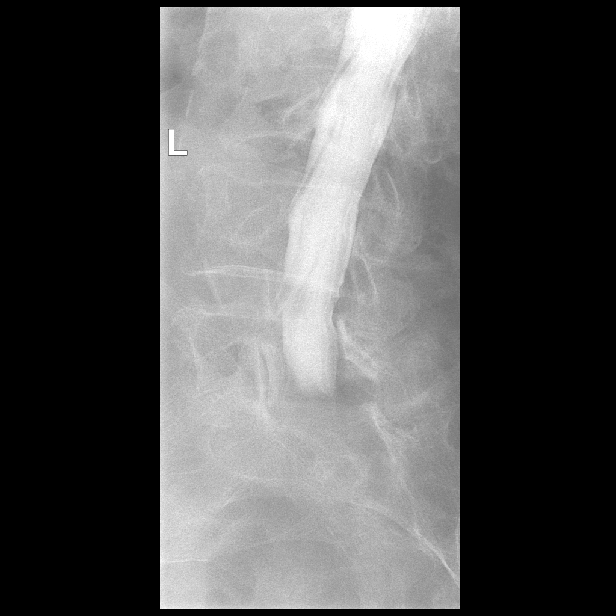

[Series 8: vasc adipose · 1 of 1 slices shown (7 of 13)]
[im 1/1]
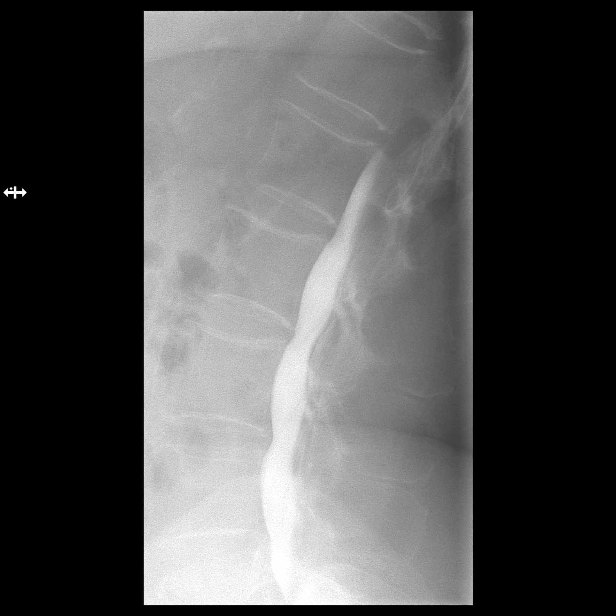

[Series 9: vasc adipose · 1 of 1 slices shown (8 of 13)]
[im 1/1]
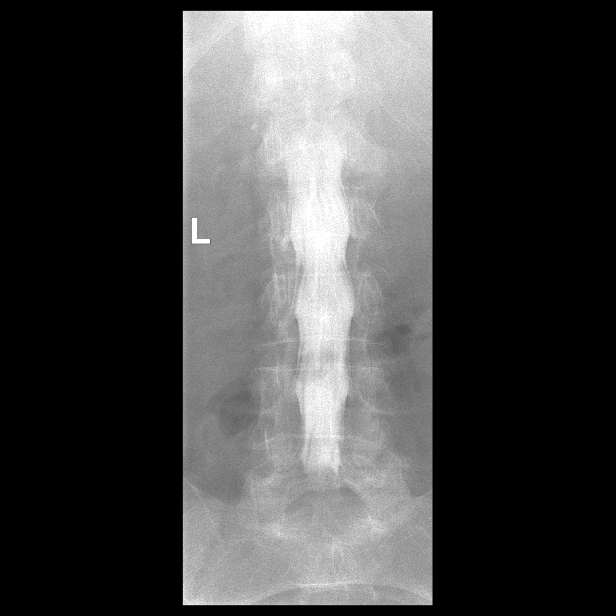

[Series 10: vasc adipose · 1 of 1 slices shown (9 of 13)]
[im 1/1]
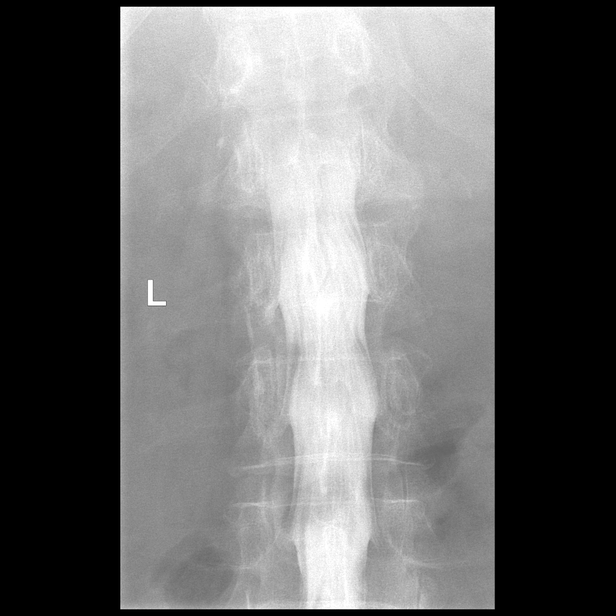

[Series 11: vasc adipose · 1 of 1 slices shown (10 of 13)]
[im 1/1]
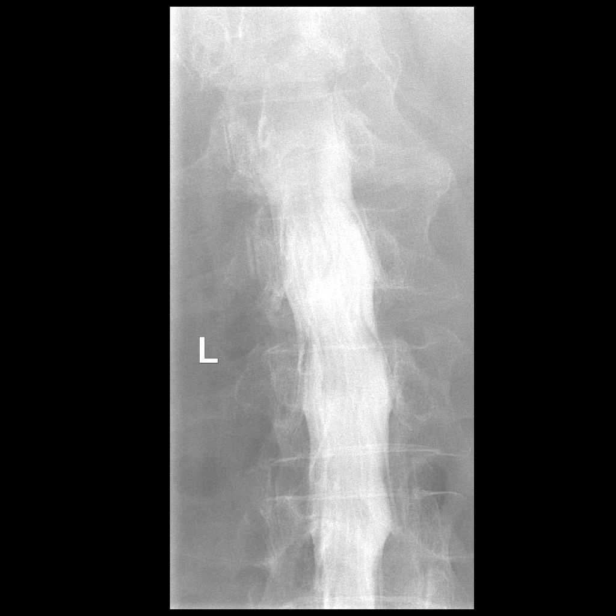

[Series 12: vasc adipose · 1 of 1 slices shown (11 of 13)]
[im 1/1]
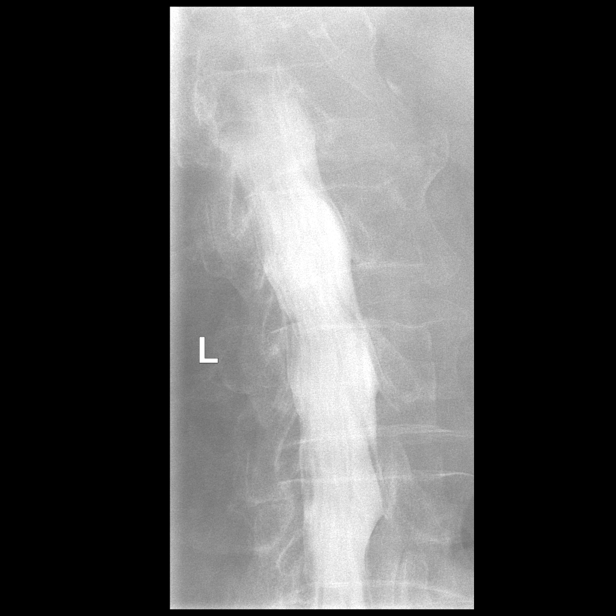

[Series 13: vasc adipose · 1 of 1 slices shown (12 of 13)]
[im 1/1]
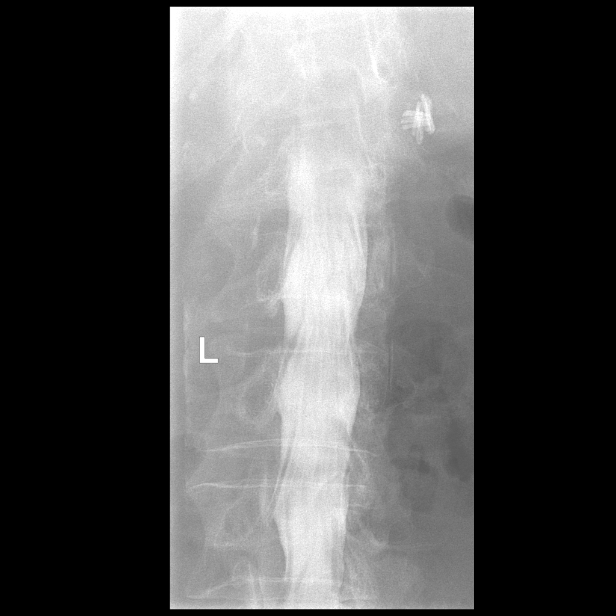

[Series 14: vasc adipose · 1 of 1 slices shown (13 of 13)]
[im 1/1]
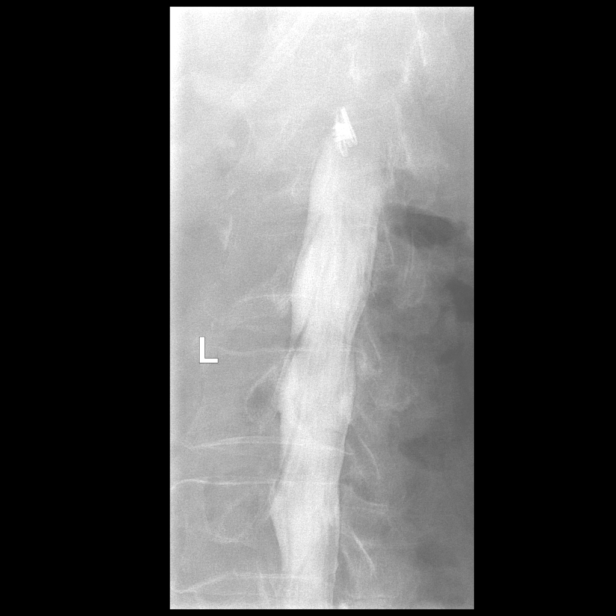

[13 of 14 positions shown; findings below may reference images not displayed]

EXAM:
LUMBAR MYELOGRAM

FLUOROSCOPY TIME:  Radiation Exposure Index (as provided by the
fluoroscopic device):

PROCEDURE:
After thorough discussion of risks and benefits of the procedure
including bleeding, infection, injury to nerves, blood vessels,
adjacent structures as well as headache and CSF leak, written and
oral informed consent was obtained. Consent was obtained by Dr.
Salomon Stanfield. Time out form was completed.

Patient was positioned prone on the fluoroscopy table. Local
anesthesia was provided with 1% lidocaine without epinephrine after
prepped and draped in the usual sterile fashion. Puncture was
performed at L3-4 using a 3 1/2 inch 22-gauge spinal needle via left
paramedian approach. Using a single pass through the dura, the
needle was placed within the thecal sac, with return of clear CSF.
15 mL of Isovue D-0EE was injected into the thecal sac, with normal
opacification of the nerve roots and cauda equina consistent with
free flow within the subarachnoid space.

I personally performed the lumbar puncture and administered the
intrathecal contrast. I also personally supervised acquisition of
the myelogram images.
FINDINGS: LUMBAR MYELOGRAM FINDINGS:

The lumbar nerve roots fill normally on both sides. Mild disc
bulging is again noted at L3-4 and L4-5. No significant stenosis is
present. Facet heights are normal. Alignment is anatomic.

Patient was somewhat unsteady standing and was unable to tolerate
standing flexion, or extension views

CT LUMBAR MYELOGRAM FINDINGS:

Lumbar spine is imaged from T11 through S2-3. Normal lumbar lordosis
is present. Leftward curvature is centered at L4-5.

Patient is status post cholecystectomy. Atherosclerotic changes are
noted in the aorta. No significant adenopathy is present.

T12-L1: Facet hypertrophy is present bilaterally without significant
stenosis.

L1-2: Mild rightward disc bulging is present. There is no
significant stenosis.

L2-3: Mild facet hypertrophy is present bilaterally. Mild disc
bulging is asymmetric left. Mild disc bulging is present. There is
no significant stenosis.

L3-4: The disc extends into the left neural foramen. Mild left
foraminal narrowing is present. The central canal and right foramen
are patent.

L4-5: A broad-based disc protrusion is present. Mild facet
hypertrophy and ligamentum flavum thickening is evident bilaterally.
Mild foraminal narrowing is worse right than left. Mild subarticular
narrowing is also worse on right.

L5-S1: A mild disc bulge is present. Mild right subarticular
narrowing is present. Facet hypertrophy and spurring is worse on the
right as well. Foramina are patent bilaterally.
IMPRESSION: 1. Mild subarticular and foraminal narrowing bilaterally at L4-5 is
worse right than left.
2. Asymmetric right-sided facet spurring at L 5 to S1.
3. Mild right subarticular narrowing at L5-S1.
4. Old mild left foraminal narrowing at L3-4.

## 2020-04-30 ENCOUNTER — Other Ambulatory Visit: Payer: Self-pay

## 2020-04-30 ENCOUNTER — Inpatient Hospital Stay: Payer: Medicare HMO | Attending: Hematology and Oncology | Admitting: Hematology and Oncology

## 2020-04-30 ENCOUNTER — Telehealth: Payer: Self-pay | Admitting: Oncology

## 2020-04-30 ENCOUNTER — Encounter: Payer: Self-pay | Admitting: Hematology and Oncology

## 2020-04-30 VITALS — BP 133/72 | HR 69 | Temp 98.3°F | Resp 18 | Ht 61.5 in | Wt 177.7 lb

## 2020-04-30 DIAGNOSIS — C50411 Malignant neoplasm of upper-outer quadrant of right female breast: Secondary | ICD-10-CM

## 2020-04-30 DIAGNOSIS — R2231 Localized swelling, mass and lump, right upper limb: Secondary | ICD-10-CM | POA: Diagnosis not present

## 2020-04-30 NOTE — Progress Notes (Signed)
[facility]  [address] [place],  [place]  [address] [phone]  Clinic Day:  04/30/2020  Referring physician: Maggie Schwalbe, PA-C   CHIEF COMPLAINT:  CC: Follow up of stage IA triple negative right breast cancer.  Current Treatment:  The patient is currently on observation.   HISTORY OF PRESENT ILLNESS:  Erin Oliver is a 84 y.o. female with a history of stage IA (T1c N0 M0) triple negative right breast cancer diagnosed in February 2018.  She underwent lumpectomy and sentinel node biopsy.  Pathology revealed an 11 mm, grade 3, invasive ductal carcinoma with high-grade ductal carcinoma in situ.  Two lymph nodes were negative for metastasis.  Estrogen and progesterone receptors were negative and her 2 Neu negative.  Ki 67 was 80%.  She declined adjuvant chemotherapy.  She did undergo adjuvant radiation therapy to the right breast completed in May 2018.  In September 2018, she fell and broke multiple ribs.  Evaluation in the emergency department with CT chest revealed nondisplaced right anterior rib fractures as well as a 2 cm nodular density within the right middle lobe.  Follow-up of the lung nodule in 3 months was recommended.  The patient is a former smoker, but quit 40 years ago.  Repeat CT chest was done in December 2018, at that time, radiation changes in the right middle lobe were felt to explain for nodular density seen on previous CT.  No further follow-up was recommended.  Dr. Holly Bodily obtained a bone density scan in February 2019, which revealed osteopenia with a T-score -1.8 in the spine and a T-score -2.4 in the femur.  The patient had previously been on oral bisphosphonates, but did not tolerate these.  We offered the options of Prolia or Reclast, but she did not really wish to try either of these.  She had a colonoscopy in August 2008 with findings of a 4 mm small sessile polyp of the rectum, 10 cm from the anal verge.  Dr. Hinton Rao  recommended she have a follow up colonoscopy.   CT abdomen in early March 2020 revealed a new mild compression fracture of T10, in the area where she complained of severe pain, as well as her right rib cage.  We recommended an MRI thoracic spine scan, which confirmed a compression fracture.  We referred her for consultation with Interventional Radiology after the MRI scan for possible kyphoplasty.  They did not feel she needed this procedure and was concerned that the pain was in the lumbar spine at that time, and radiating down her leg.  MRI lumbar spine was abnormal, but did not show fracture or evidence of metastatic disease.  She also saw Dr. Arnoldo Morale, orthopedic surgeon, in [place].  She was found to have deep venous thrombosis of the right lower extremity in August 2020 and was placed on Xarelto.  Repeat MRI thoracic spine in January 2021 revealed healed mild compression fractures of T6 and T10, with new facet arthritis at T8-9 and T9-10, left greater than right, without neural impingement or foraminal stenosis.  She underwent physical therapy for 6 weeks with improvement in the back pain.    Annual mammogram from March 2021 was clear.  Bone density scan in July revealed osteoporosis with a T-score of -3.3 in the wrist with stable osteopenia with a T-score of -2.4 in the right femur neck.  The right radius bone density had not been previously tested. As this was stable, the patient did not wish to  consider treatment of her osteoporosis.  We recommended she increase her calcium and vitamin-D supplement to twice daily.  Repeat bone density scan in 2 years was recommended.  INTERVAL HISTORY:  Erin Oliver is seen today for six-month follow-up of her stage IA triple negative breast cancer. Since her last visit, she has been doing well.  She has occasional sticking pain in the right breast, but otherwise denies any changes in the breasts.  She reports pain in her back in certain positions.  Her appetite is good.  Her weight has increased 2 pounds over the last six months.  She states she is taking calcium and vitamin-D twice daily.  She states she had a virtual visit with Dr. Lyndel Safe and he did not recommend repeat colonoscopy.   REVIEW OF SYSTEMS:  Review of Systems  Constitutional: Negative.   HENT:  Negative.   Respiratory: Positive for shortness of breath (mild, with exertion only). Negative for chest tightness and cough.   Cardiovascular: Negative.   Gastrointestinal: Negative.   Musculoskeletal: Positive for back pain (chronic, mild). Negative for arthralgias and myalgias.  Skin: Negative.   Neurological: Negative.   Hematological: Negative.   Psychiatric/Behavioral: Negative for depression. The patient is not nervous/anxious.      VITALS:  Blood pressure 133/72, pulse 69, temperature 98.3 F (36.8 C), temperature source Oral, resp. rate 18, height 5' 1.5" (1.562 m), weight 177 lb 11.2 oz (80.6 kg), SpO2 93 %.  Wt Readings from Last 3 Encounters:  04/30/20 177 lb 11.2 oz (80.6 kg)  07/30/19 174 lb (78.9 kg)  11/01/15 173 lb 11.2 oz (78.8 kg)    Body mass index is 33.03 kg/m.  Performance status (ECOG): 1 - Symptomatic but completely ambulatory  PHYSICAL EXAM:  Physical Exam Constitutional:      General: She is not in acute distress.    Appearance: Normal appearance.  HENT:     Mouth/Throat:     Mouth: Mucous membranes are moist.     Pharynx: Oropharynx is clear.  Eyes:     Extraocular Movements: Extraocular movements intact.     Conjunctiva/sclera: Conjunctivae normal.     Pupils: Pupils are equal, round, and reactive to light.  Cardiovascular:     Rate and Rhythm: Normal rate and regular rhythm.     Heart sounds: Normal heart sounds.  Pulmonary:     Breath sounds: Normal breath sounds.  Chest:     Breasts: Breasts are asymmetrical (right breast due to lumpectomy).        Right: Tenderness (mild, along lateral incision) present. No mass, nipple discharge or skin change.          Left: No mass, nipple discharge, skin change or tenderness.  Abdominal:     General: There is no distension.     Palpations: Abdomen is soft. There is no hepatomegaly, splenomegaly or mass.     Tenderness: There is no abdominal tenderness.  Musculoskeletal:        General: No swelling.     Cervical back: Neck supple.  Lymphadenopathy:     Cervical: No cervical adenopathy.     Upper Body:     Right upper body: Axillary adenopathy (soft mass right lower axilla) present. No supraclavicular adenopathy.     Left upper body: No supraclavicular adenopathy.     Lower Body: No right inguinal adenopathy. No left inguinal adenopathy.  Skin:    General: Skin is warm.     Findings: No rash.  Neurological:     General:  No focal deficit present.     Mental Status: She is alert and oriented to person, place, and time.  Psychiatric:        Mood and Affect: Mood normal.        Behavior: Behavior normal.    LABS:   CBC Latest Ref Rng & Units 11/01/2015  WBC 4.0 - 10.5 K/uL 6.4  Hemoglobin 12.0 - 15.0 g/dL 13.1  Hematocrit 36 - 46 % 40.8  Platelets 150 - 400 K/uL 355   CMP Latest Ref Rng & Units 11/01/2015  Glucose 65 - 99 mg/dL 92  BUN 6 - 20 mg/dL 19  Creatinine 0.44 - 1.00 mg/dL 1.33(H)  Sodium 135 - 145 mmol/L 140  Potassium 3.5 - 5.1 mmol/L 3.8  Chloride 101 - 111 mmol/L 105  CO2 22 - 32 mmol/L 26  Calcium 8.9 - 10.3 mg/dL 9.1  Total Protein 6.5 - 8.1 g/dL 6.9  Total Bilirubin 0.3 - 1.2 mg/dL 0.6  Alkaline Phos 38 - 126 U/L 55  AST 15 - 41 U/L 25  ALT 14 - 54 U/L 20     No results found for: CEA1 / No results found for: CEA1 No results found for: PSA1 No results found for: GNF621 No results found for: HYQ657  No results found for: TOTALPROTELP, ALBUMINELP, A1GS, A2GS, BETS, BETA2SER, GAMS, MSPIKE, SPEI No results found for: TIBC, FERRITIN, IRONPCTSAT No results found for: LDH  STUDIES:  No results found.    HISTORY:   Past Medical History:  Diagnosis Date  .  Hypertension   . Hypothyroidism     Past Surgical History:  Procedure Laterality Date  . ABDOMINAL HYSTERECTOMY    . BREAST SURGERY    . CHOLECYSTECTOMY    . COLONOSCOPY  01/28/2005   Diminutive colonic polyp status post polypectomy. Moderate sigmoid diverticulosis. Internal hemorrhoids  . right knee arthroscopy      Family History  Problem Relation Age of Onset  . Leukemia Mother   . Congestive Heart Failure Mother   . Lung cancer Sister   . Heart disease Brother   . Colon cancer Neg Hx     Social History:  reports that she has quit smoking. Her smoking use included cigarettes. She uses smokeless tobacco. She reports that she does not drink alcohol. No history on file for drug use.The patient is alone today.  Allergies:  Allergies  Allergen Reactions  . Penicillins Hives    Has patient had a PCN reaction causing immediate rash, facial/tongue/throat swelling, SOB or lightheadedness with hypotension: Yes Has patient had a PCN reaction causing severe rash involving mucus membranes or skin necrosis: No Has patient had a PCN reaction that required hospitalization No Has patient had a PCN reaction occurring within the last 10 years: No If all of the above answers are "NO", then may proceed with Cephalosporin use.     Current Medications: Current Outpatient Medications  Medication Sig Dispense Refill  . amLODipine (NORVASC) 10 MG tablet Take 10 mg by mouth daily.    . Ascorbic Acid (VITAMIN C) 100 MG tablet Take by mouth daily.    Marland Kitchen aspirin 81 MG chewable tablet Chew 1 tablet (81 mg total) by mouth daily. 30 tablet 0  . benazepril (LOTENSIN) 40 MG tablet Take 40 mg by mouth daily.    . calcium carbonate (OSCAL) 1500 (600 Ca) MG TABS tablet Take 1,500 mg by mouth 2 (two) times daily with a meal.    . cholecalciferol (VITAMIN D) 1000 units tablet  Take 1,000 Units by mouth daily.    . fluticasone (FLONASE) 50 MCG/ACT nasal spray Place 1 spray into both nostrils 2 (two) times  daily.    Marland Kitchen levothyroxine (SYNTHROID) 25 MCG tablet Take 25 mcg by mouth daily before breakfast.    . lovastatin (MEVACOR) 40 MG tablet Take 40 mg by mouth at bedtime.    . naproxen (NAPROSYN) 500 MG tablet Take 500 mg by mouth 2 (two) times daily with a meal.    . omeprazole (PRILOSEC) 20 MG capsule Take 20 mg by mouth daily.    Marland Kitchen zinc sulfate 220 (50 Zn) MG capsule Take 220 mg by mouth daily.     No current facility-administered medications for this visit.     ASSESSMENT & PLAN:   Assessment/Plan: 1. History of stage IA triple negative breast cancer diagnosed in February 2018, treated with surgery and adjuvant radiation.  She declined adjuvant chemotherapy due to her age.  She remains without evidence of recurrence. 2. Compression fracture T10 with significant degenerative disc disease. Her pain and ability to function improved with physical therapy. 3. Osteopenia/osteoporosis on most recent bone density scan done in July 2021. She continues calcium and vitamin-D twice daily.  She has declined additionaltreatment for her bones. 4. History of colon polyps.  Repeat colonoscopy was not recommended by Dr. Lyndel Safe. 5. Soft tissue mass in the right axilla, not extremely suspicious for malignancy, but we do recommend ultrasound of the right axilla for further evaluation.  We will plan to contact her with the results of the ultrasound.  If benign findings, we will plan to see her back in 6 months for continued follow-up.  The patient understands the plans discussed today and is in agreement with them.  She knows to contact our office if she develops concerns regarding her breast cancer.  The patient was seen, examined and plan formulated with Dr. Hinton Rao.   Marvia Pickles, PA-C

## 2020-04-30 NOTE — Telephone Encounter (Signed)
Patient's U/S Rt Upper Extremity/Axilla sch for 11/12 - Gave patient Appt plus her Oct 29, 2020 F/U Appt

## 2020-05-06 ENCOUNTER — Telehealth: Payer: Self-pay | Admitting: Hematology and Oncology

## 2020-05-06 NOTE — Telephone Encounter (Signed)
Telephoned patient with results of right axillary U/S, which was negative.

## 2020-08-11 ENCOUNTER — Telehealth: Payer: Self-pay | Admitting: Hematology and Oncology

## 2020-08-11 ENCOUNTER — Other Ambulatory Visit: Payer: Self-pay | Admitting: Hematology and Oncology

## 2020-08-11 ENCOUNTER — Inpatient Hospital Stay: Payer: Medicare HMO | Attending: Hematology and Oncology | Admitting: Hematology and Oncology

## 2020-08-11 ENCOUNTER — Inpatient Hospital Stay: Payer: Medicare HMO

## 2020-08-11 ENCOUNTER — Telehealth: Payer: Self-pay

## 2020-08-11 VITALS — BP 177/86 | HR 71 | Temp 98.2°F | Resp 18 | Ht 61.5 in | Wt 179.4 lb

## 2020-08-11 DIAGNOSIS — C50411 Malignant neoplasm of upper-outer quadrant of right female breast: Secondary | ICD-10-CM

## 2020-08-11 LAB — COMPREHENSIVE METABOLIC PANEL
Albumin: 4.2 (ref 3.5–5.0)
Calcium: 8.9 (ref 8.7–10.7)

## 2020-08-11 LAB — BASIC METABOLIC PANEL
BUN: 25 — AB (ref 4–21)
CO2: 26 — AB (ref 13–22)
Chloride: 104 (ref 99–108)
Creatinine: 1.2 — AB (ref 0.5–1.1)
Glucose: 129
Potassium: 4.3 (ref 3.4–5.3)
Sodium: 138 (ref 137–147)

## 2020-08-11 LAB — CBC AND DIFFERENTIAL
HCT: 41 (ref 36–46)
Hemoglobin: 13.6 (ref 12.0–16.0)
Neutrophils Absolute: 4.28
Platelets: 380 (ref 150–399)
WBC: 6.9

## 2020-08-11 LAB — HEPATIC FUNCTION PANEL
ALT: 19 (ref 7–35)
AST: 31 (ref 13–35)
Alkaline Phosphatase: 72 (ref 25–125)
Bilirubin, Total: 0.3

## 2020-08-11 LAB — CBC: RBC: 4.6 (ref 3.87–5.11)

## 2020-08-11 MED ORDER — CEPHALEXIN 500 MG PO CAPS: 500.0000 mg | ORAL_CAPSULE | Freq: Two times a day (BID) | ORAL | 0 refills | Status: DC

## 2020-08-11 NOTE — Progress Notes (Signed)
[facility]  [address] [place],  [place]  [address] [phone]  Clinic Day:  08/14/2020  Referring physician: Maggie Schwalbe, PA-C   CHIEF COMPLAINT:  CC: An 85 year old female with a history of stage IA triple negative breast cancer here for new pain and swelling to her right breast  Current Treatment:  Surveillance   HISTORY OF PRESENT ILLNESS:  Erin Oliver is a 85 y.o. female with a history of stage IA (T1c N0 M0) triple negative right breast cancer diagnosed in February 2018.  She underwent lumpectomy and sentinel node biopsy.  Pathology revealed an 11 mm, grade 3, invasive ductal carcinoma with high-grade ductal carcinoma in situ.  Two lymph nodes were negative for metastasis.  Estrogen and progesterone receptors were negative and her 2 Neu negative.  Ki 67 was 80%.  She declined adjuvant chemotherapy.  She did undergo adjuvant radiation therapy to the right breast completed in May 2018.  She had cellulitis of the right breast right after completing radiation therapy with drainage from the breast.  She required multiple courses of antibiotics.  In September 2018, she fell and broke multiple ribs.  Evaluation in the emergency department with CT chest revealed nondisplaced right anterior rib fractures as well as a 2 cm nodular density within the right middle lobe.  Follow-up of the lung nodule in 3 months was recommended.  The patient is a former smoker, but quit 40 years ago.  Repeat CT chest was done in December 2018, at that time, radiation changes in the right middle lobe were felt to explain for nodular density seen on previous CT.  No further follow-up was recommended.  Dr. Holly Bodily obtained a bone density scan in February 2019, which revealed osteopenia with a T-score -1.8 in the spine and a T-score -2.4 in the femur.  The patient had previously been on oral bisphosphonates, but did not tolerate these.  We offered the options of Prolia or  Reclast, but she did not really wish to try either of these.  Bilateral diagnostic mammogram in February 2019 did not reveal any evidence of malignancy.  She had a skin cancer removed from her right face in July 2019.  She had a colonoscopy in August 2008 with findings of a 4 mm small sessile polyp of the rectum, 10 cm from the anal verge.   CT abdomen in early March 2020 revealed a new mild compression fracture of T10, and this was the area where she complained of severe pain, as well as her right rib cage.  We recommended an MRI thoracic spine scan, which confirmed a compression fracture.  We referred her for consultation with Interventional Radiology after the MRI scan, for possible kyphoplasty.  They did not feel she needed this procedure and was concerned that the pain was in the lumbar spine at that time, and radiating down her leg.  MRI lumbar spine was abnormal, but did not show fracture or evidence of metastatic disease.  She also saw Dr. Newman Pies, surgeon, in [place].  She was found to have deep venous thrombosis of the right lower extremity and August and was placed on Xarelto.  Review of her hospital record revealed the MRI thoracic spine to show old healed mild compression fractures of T6 and T10, with new facet arthritis at T8-9 and T9-10, left greater than right, without neural impingement or foraminal stenosis. Annual mammogram from March 2021 was clear.  INTERVAL HISTORY:  Erin Oliver is here today for  new onset of pain and swelling to her right breast. She states she noticed this a few days ago and became concerned when it didn't go away. She does not recall any trauma to the area. She is due for her annual mammogram March 4th and is concerned that it will too painful to undergo at this time. She denies redness or warmth to the area. She reports feeling tired all the time, but states this is chronic. She does have urinary incontinence that is worse at night. She states this is also  chronic. She has occasionally been medicated for this, but states nothing really works. She denies fever, chills, nausea or vomiting. Her appetite is good and her weight has been stable. She denies shortness of breath, cough or chest pain. She denies constipation or diarrhea. CBC and CMP are unremarkable. REVIEW OF SYSTEMS:  Review of Systems  Constitutional: Positive for fatigue.  Genitourinary: Positive for bladder incontinence.      VITALS:  Blood pressure (!) 177/86, pulse 71, temperature 98.2 F (36.8 C), temperature source Oral, resp. rate 18, height 5' 1.5" (1.562 m), weight 179 lb 6.4 oz (81.4 kg), SpO2 95 %.  Wt Readings from Last 3 Encounters:  08/11/20 179 lb 6.4 oz (81.4 kg)  04/30/20 177 lb 11.2 oz (80.6 kg)  07/30/19 174 lb (78.9 kg)    Body mass index is 33.35 kg/m.  Performance status (ECOG): 1 - Symptomatic but completely ambulatory  PHYSICAL EXAM:  Physical Exam Constitutional:      General: She is not in acute distress.    Appearance: Normal appearance. She is normal weight. She is not ill-appearing, toxic-appearing or diaphoretic.  HENT:     Head: Normocephalic and atraumatic.     Nose: Nose normal. No congestion or rhinorrhea.     Mouth/Throat:     Mouth: Mucous membranes are moist.     Pharynx: Oropharynx is clear. No oropharyngeal exudate or posterior oropharyngeal erythema.  Eyes:     General: No scleral icterus.       Right eye: No discharge.        Left eye: No discharge.     Extraocular Movements: Extraocular movements intact.     Conjunctiva/sclera: Conjunctivae normal.     Pupils: Pupils are equal, round, and reactive to light.  Neck:     Vascular: No carotid bruit.  Cardiovascular:     Rate and Rhythm: Normal rate and regular rhythm.     Heart sounds: No murmur heard. No friction rub. No gallop.   Pulmonary:     Effort: Pulmonary effort is normal. No respiratory distress.     Breath sounds: Normal breath sounds. No stridor. No wheezing,  rhonchi or rales.  Chest:     Chest wall: No mass, lacerations, deformity, swelling, tenderness, crepitus or edema. There is no dullness to percussion.  Breasts: Breasts are symmetrical.     Right: Skin change and tenderness present. No swelling, bleeding, inverted nipple, mass, nipple discharge, axillary adenopathy or supraclavicular adenopathy.     Left: Normal. No swelling, bleeding, inverted nipple, mass, nipple discharge, skin change, tenderness, axillary adenopathy or supraclavicular adenopathy.    Abdominal:     General: Abdomen is flat. Bowel sounds are normal. There is no distension.     Palpations: There is no mass.     Tenderness: There is no abdominal tenderness. There is no right CVA tenderness, left CVA tenderness, guarding or rebound.     Hernia: No hernia is present.  Musculoskeletal:  General: No swelling, tenderness, deformity or signs of injury. Normal range of motion.     Cervical back: Normal range of motion and neck supple. No rigidity or tenderness.     Right lower leg: No edema.     Left lower leg: No edema.  Lymphadenopathy:     Cervical: No cervical adenopathy.     Upper Body:     Right upper body: No supraclavicular, axillary or pectoral adenopathy.     Left upper body: No supraclavicular, axillary or pectoral adenopathy.  Skin:    General: Skin is warm and dry.     Capillary Refill: Capillary refill takes less than 2 seconds.     Coloration: Skin is not jaundiced or pale.     Findings: No bruising, erythema, lesion or rash.  Neurological:     General: No focal deficit present.     Mental Status: She is alert and oriented to person, place, and time. Mental status is at baseline.     Cranial Nerves: No cranial nerve deficit.     Sensory: No sensory deficit.     Motor: No weakness.     Coordination: Coordination normal.     Gait: Gait normal.     Deep Tendon Reflexes: Reflexes normal.  Psychiatric:        Mood and Affect: Mood normal.         Behavior: Behavior normal.        Thought Content: Thought content normal.        Judgment: Judgment normal.     LABS:   CBC Latest Ref Rng & Units 08/11/2020 11/01/2015  WBC - 6.9 6.4  Hemoglobin 12.0 - 16.0 13.6 13.1  Hematocrit 36 - 46 41 40.8  Platelets 150 - 399 380 355   CMP Latest Ref Rng & Units 08/11/2020 11/01/2015  Glucose 65 - 99 mg/dL - 92  BUN 4 - 21 25(A) 19  Creatinine 0.5 - 1.1 1.2(A) 1.33(H)  Sodium 137 - 147 138 140  Potassium 3.4 - 5.3 4.3 3.8  Chloride 99 - 108 104 105  CO2 13 - 22 26(A) 26  Calcium 8.7 - 10.7 8.9 9.1  Total Protein 6.5 - 8.1 g/dL - 6.9  Total Bilirubin 0.3 - 1.2 mg/dL - 0.6  Alkaline Phos 25 - 125 72 55  AST 13 - 35 31 25  ALT 7 - 35 19 20     No results found for: CEA1 / No results found for: CEA1 No results found for: PSA1 No results found for: WLN989 No results found for: QJJ941  No results found for: TOTALPROTELP, ALBUMINELP, A1GS, A2GS, BETS, BETA2SER, GAMS, MSPIKE, SPEI No results found for: TIBC, FERRITIN, IRONPCTSAT No results found for: LDH  STUDIES:  No results found.    HISTORY:   Past Medical History:  Diagnosis Date  . Hypertension   . Hypothyroidism     Past Surgical History:  Procedure Laterality Date  . ABDOMINAL HYSTERECTOMY    . BREAST SURGERY    . CHOLECYSTECTOMY    . COLONOSCOPY  01/28/2005   Diminutive colonic polyp status post polypectomy. Moderate sigmoid diverticulosis. Internal hemorrhoids  . right knee arthroscopy      Family History  Problem Relation Age of Onset  . Leukemia Mother   . Congestive Heart Failure Mother   . Lung cancer Sister   . Heart disease Brother   . Colon cancer Neg Hx     Social History:  reports that she has quit smoking. Her  smoking use included cigarettes. She uses smokeless tobacco. She reports that she does not drink alcohol. No history on file for drug use.The patient is alone  today.  Allergies:  Allergies  Allergen Reactions  . Penicillins Hives    Has  patient had a PCN reaction causing immediate rash, facial/tongue/throat swelling, SOB or lightheadedness with hypotension: Yes Has patient had a PCN reaction causing severe rash involving mucus membranes or skin necrosis: No Has patient had a PCN reaction that required hospitalization No Has patient had a PCN reaction occurring within the last 10 years: No If all of the above answers are "NO", then may proceed with Cephalosporin use.     Current Medications: Current Outpatient Medications  Medication Sig Dispense Refill  . amLODipine (NORVASC) 10 MG tablet Take 10 mg by mouth daily.    . Ascorbic Acid (VITAMIN C) 100 MG tablet Take by mouth daily.    Marland Kitchen aspirin 81 MG chewable tablet Chew 1 tablet (81 mg total) by mouth daily. 30 tablet 0  . benazepril (LOTENSIN) 40 MG tablet Take 40 mg by mouth daily.    . calcium carbonate (OSCAL) 1500 (600 Ca) MG TABS tablet Take 1,500 mg by mouth 2 (two) times daily with a meal.    . cephALEXin (KEFLEX) 500 MG capsule Take 1 capsule (500 mg total) by mouth 2 (two) times daily. 14 capsule 0  . cholecalciferol (VITAMIN D) 1000 units tablet Take 1,000 Units by mouth daily.    . fluticasone (FLONASE) 50 MCG/ACT nasal spray Place 1 spray into both nostrils 2 (two) times daily.    Marland Kitchen levothyroxine (SYNTHROID) 25 MCG tablet Take 25 mcg by mouth daily before breakfast.    . lovastatin (MEVACOR) 40 MG tablet Take 40 mg by mouth at bedtime.    . naproxen (NAPROSYN) 500 MG tablet Take 500 mg by mouth 2 (two) times daily with a meal.    . omeprazole (PRILOSEC) 20 MG capsule Take 20 mg by mouth daily.    Marland Kitchen zinc sulfate 220 (50 Zn) MG capsule Take 220 mg by mouth daily.     No current facility-administered medications for this visit.     ASSESSMENT & PLAN:   Assessment:  Erin Oliver is a 85 y.o. female   1. Stage IA triple negative breast cancer diagnosed in February 2018, treated with surgery and adjuvant radiation.  She declined adjuvant chemotherapy  due to age. New onset of right breast pain and slight redness on exam. No masses noted.  Plan: As her mammogram is greater than a week away, we will try a course of antibiotics to see if there is a response. I will send in Keflex for one week twice daily. We will continue with the plan for diagnostic mammogram. If she is unable to undergo mammogram, we will still attempt ultrasound of the right breast to evaluate this new pain. We will plan to see her back in 3 weeks to discuss imaging results.   She verbalizes understanding of and agreement to the plans discussed today. She knows to call the office should any new questions or concerns arise.      Melodye Ped, NP

## 2020-08-11 NOTE — Telephone Encounter (Signed)
Per 2/21 LOS, patient scheduled for March Appt.  Gave patient Appt [employer]

## 2020-08-11 NOTE — Telephone Encounter (Addendum)
Pt wants to come today. She will be here @ 330p   Attempted call to pt, no answer.  ----- Message from Melodye Ped, NP sent at 08/11/2020  1:01 PM EST ----- Regarding: RE: breast sore to touch Yes, I can see her this afternoon if she can come in, otherwise I can see her tomorrow.  ----- Message ----- From: Derwood Kaplan, MD Sent: 08/11/2020  12:37 PM EST To: Melodye Ped, NP, Dairl Ponder, RN Subject: RE: breast sore to touch                       Yes, she needs to postpone the mammo and we need to examine, worried about mastitis and may need ab's.  Can Melissa fit in today or tomorrow? ----- Message ----- From: Dairl Ponder, RN Sent: 08/11/2020  10:55 AM EST To: Derwood Kaplan, MD Subject: breast sore to touch                           Pt called to report that her breast has been really sore since Friday. It is the same breast, near the surgical site from 2918. Pt states it is a little red, but is tender. No warmth. No fevers. No open areas/discharge. She is scheduled for diagnostic mammo in the next couple days, & states I cant have that done right now. Im too sore. Please advise.

## 2020-08-29 NOTE — Progress Notes (Signed)
[facility]  [address] [place],  [place]  [address] [phone]  Clinic Day:  09/01/2020  Referring physician: Maggie Schwalbe, PA-C  This document serves as a record of services personally performed by Hosie Poisson, MD. It was created on their behalf by Curry,Lauren E, a trained medical scribe. The creation of this record is based on the scribe's personal observations and the provider's statements to them.  CHIEF COMPLAINT:  CC: History of stage IA triple negative breast cancer   Current Treatment:  Surveillance  HISTORY OF PRESENT ILLNESS:  Shuntia Exton Friberg is a 85 y.o. female with a history of stage IA (T1c N0 M0) triple negative right breast cancer diagnosed in February 2018.  She underwent lumpectomy and sentinel node biopsy.  Pathology revealed an 11 mm, grade 3, invasive ductal carcinoma with high-grade ductal carcinoma in situ.  Two lymph nodes were negative for metastasis.  Estrogen and progesterone receptors were negative and her 2 Neu negative.  Ki 67 was 80%.  She declined adjuvant chemotherapy.  She did undergo adjuvant radiation therapy to the right breast completed in May 2018.  She had cellulitis of the right breast right after completing radiation therapy with drainage from the breast.  She required multiple courses of antibiotics.  In September 2018, she fell and broke multiple ribs.  Evaluation in the emergency department with CT chest revealed nondisplaced right anterior rib fractures as well as a 2 cm nodular density within the right middle lobe.  Follow-up of the lung nodule in 3 months was recommended.  The patient is a former smoker, but quit 40 years ago.  Repeat CT chest was done in December 2018, at that time, radiation changes in the right middle lobe were felt to explain for nodular density seen on previous CT.  No further follow-up was recommended.  Dr. Holly Bodily obtained a bone density scan in February 2019, which  revealed osteopenia with a T-score -1.8 in the spine and a T-score -2.4 in the femur.  The patient had previously been on oral bisphosphonates, but did not tolerate these.  We offered the options of Prolia or Reclast, but she did not really wish to try either of these.  Bilateral diagnostic mammogram in February 2019 did not reveal any evidence of malignancy.  She had a skin cancer removed from her right face in July 2019.  She had a colonoscopy in August 2008 with findings of a 4 mm small sessile polyp of the rectum, 10 cm from the anal verge.   CT abdomen in early March 2020 revealed a new mild compression fracture of T10, and this was the area where she complained of severe pain, as well as her right rib cage.  We recommended an MRI thoracic spine scan, which confirmed a compression fracture.  We referred her for consultation with Interventional Radiology after the MRI scan, for possible kyphoplasty.  They did not feel she needed this procedure and was concerned that the pain was in the lumbar spine at that time, and radiating down her leg.  MRI lumbar spine was abnormal, but did not show fracture or evidence of metastatic disease.  She also saw Dr. Newman Pies, surgeon, in [place].  She was found to have deep venous thrombosis of the right lower extremity and August and was placed on Xarelto.  Review of her hospital record revealed the MRI thoracic spine to show old healed mild compression fractures of T6 and T10, with new facet arthritis at  T8-9 and T9-10, left greater than right, without neural impingement or foraminal stenosis.  Bone density from May 2021 revealed osteoporosis with a T-score of -3.3 of the left forearm.  Right femur neck is stable at -2.4, and dual femur total mean measures -2.0, previously -1.8.  INTERVAL HISTORY:  Brion is here for routine follow up and states that she was having soreness of the right breast.  She feels this was due to trimming hedges with sheers.  She was given  a course of antibiotics and this has improved.  Bilateral diagnostic mammogram from March 2022 was clear and so screening mammogram in 1 year was recommended.  She continues to have chronic back pain, but is able to do her daily activities.  She states that she needs a knee replacement, but does not wish to pursue this until later in the year.  Her  appetite is good, and she has gained 6 pounds since her last visit.  She denies fever, chills or other signs of infection.  She denies nausea, vomiting, bowel issues, or abdominal pain.  She denies sore throat, cough, dyspnea, or chest pain.   REVIEW OF SYSTEMS:  Review of Systems  Constitutional: Negative.  Negative for appetite change, chills, fatigue, fever and unexpected weight change.  HENT:  Negative.   Eyes: Negative.   Respiratory: Negative.  Negative for chest tightness, cough, hemoptysis, shortness of breath and wheezing.   Cardiovascular: Negative.  Negative for chest pain, leg swelling and palpitations.  Gastrointestinal: Negative.  Negative for abdominal distention, abdominal pain, blood in stool, constipation, diarrhea, nausea and vomiting.  Endocrine: Negative.   Genitourinary: Negative.  Negative for difficulty urinating, dysuria, frequency and hematuria.   Musculoskeletal: Positive for arthralgias and back pain (chronic). Negative for flank pain, gait problem and myalgias.       Soreness of the right breast, improved  Skin: Negative.   Neurological: Negative.  Negative for dizziness, extremity weakness, gait problem, headaches, light-headedness, numbness, seizures and speech difficulty.  Hematological: Negative.   Psychiatric/Behavioral: Positive for sleep disturbance (due to back pain\). Negative for depression. The patient is not nervous/anxious.      VITALS:  Blood pressure (!) 151/70, pulse 76, temperature 97.8 F (36.6 C), resp. rate 18, weight 182 lb (82.6 kg), SpO2 95 %.  Wt Readings from Last 3 Encounters:  09/01/20 182 lb  (82.6 kg)  08/11/20 179 lb 6.4 oz (81.4 kg)  04/30/20 177 lb 11.2 oz (80.6 kg)    Body mass index is 33.83 kg/m.  Performance status (ECOG): 1 - Symptomatic but completely ambulatory  PHYSICAL EXAM:  Physical Exam Constitutional:      General: She is not in acute distress.    Appearance: Normal appearance. She is normal weight.  HENT:     Head: Normocephalic and atraumatic.  Eyes:     General: No scleral icterus.    Extraocular Movements: Extraocular movements intact.     Conjunctiva/sclera: Conjunctivae normal.     Pupils: Pupils are equal, round, and reactive to light.  Cardiovascular:     Rate and Rhythm: Normal rate and regular rhythm.     Pulses: Normal pulses.     Heart sounds: Normal heart sounds. No murmur heard. No friction rub. No gallop.   Pulmonary:     Effort: Pulmonary effort is normal. No respiratory distress.     Breath sounds: Normal breath sounds.  Chest:     Comments: Deep scar in the lateral right breast is well healed.  There is  a nodular area at 3 o'clock in the right breast about 1-2 cm.  Both breasts are without masses. Abdominal:     General: Bowel sounds are normal. There is no distension.     Palpations: Abdomen is soft. There is no hepatomegaly, splenomegaly or mass.     Tenderness: There is no abdominal tenderness.  Musculoskeletal:        General: Normal range of motion.     Cervical back: Normal range of motion and neck supple.     Right lower leg: No edema.     Left lower leg: No edema.  Lymphadenopathy:     Cervical: No cervical adenopathy.  Skin:    General: Skin is warm and dry.  Neurological:     General: No focal deficit present.     Mental Status: She is alert and oriented to person, place, and time. Mental status is at baseline.  Psychiatric:        Mood and Affect: Mood normal.        Behavior: Behavior normal.        Thought Content: Thought content normal.        Judgment: Judgment normal.     LABS:   CBC Latest Ref  Rng & Units 08/11/2020 11/01/2015  WBC - 6.9 6.4  Hemoglobin 12.0 - 16.0 13.6 13.1  Hematocrit 36 - 46 41 40.8  Platelets 150 - 399 380 355   CMP Latest Ref Rng & Units 08/11/2020 11/01/2015  Glucose 65 - 99 mg/dL - 92  BUN 4 - 21 25(A) 19  Creatinine 0.5 - 1.1 1.2(A) 1.33(H)  Sodium 137 - 147 138 140  Potassium 3.4 - 5.3 4.3 3.8  Chloride 99 - 108 104 105  CO2 13 - 22 26(A) 26  Calcium 8.7 - 10.7 8.9 9.1  Total Protein 6.5 - 8.1 g/dL - 6.9  Total Bilirubin 0.3 - 1.2 mg/dL - 0.6  Alkaline Phos 25 - 125 72 55  AST 13 - 35 31 25  ALT 7 - 35 19 20     STUDIES:   EXAM: 08/20/2020 DIGITAL DIAGNOSTIC BILATERAL MAMMOGRAM WITH TOMOSYNTHESIS AND CAD  TECHNIQUE: Bilateral digital diagnostic mammography and breast tomosynthesis was performed. The images were evaluated with computer-aided detection.  COMPARISON:  Previous exam(s).  ACR Breast Density Category b: There are scattered areas of fibroglandular density.  FINDINGS: Right breast: Spot 2D magnification views of the lumpectomy site were performed in addition to standard views. The lumpectomy site is only partially visualized due to far posterior location. There are stable postsurgical changes. No suspicious mass, distortion, or microcalcifications are identified to suggest presence of malignancy.  Left breast: No suspicious mass, distortion, or microcalcifications are identified to suggest presence of malignancy.  IMPRESSION: Stable postsurgical changes in the right breast. No mammographic evidence of malignancy bilaterally.  HISTORY:   Allergies:  Allergies  Allergen Reactions  . Hydrocodone-Acetaminophen     Other reaction(s): Mental Status Changes (intolerance)  . Penicillins Hives    Has patient had a PCN reaction causing immediate rash, facial/tongue/throat swelling, SOB or lightheadedness with hypotension: Yes Has patient had a PCN reaction causing severe rash involving mucus membranes or skin necrosis:  No Has patient had a PCN reaction that required hospitalization No Has patient had a PCN reaction occurring within the last 10 years: No If all of the above answers are "NO", then may proceed with Cephalosporin use.  Other reaction(s): Hives Has patient had a PCN reaction causing immediate rash, facial/tongue/throat swelling,  SOB or lightheadedness with hypotension: Yes Has patient had a PCN reaction causing severe rash involving mucus membranes or skin necrosis: No Has patient had a PCN reaction that required hospitalization No Has patient had a PCN reaction occurring within the last 10 years: No If all of the above answers are "NO", then may proceed with Cephalosporin use. Has patient had a PCN reaction causing immediate rash, facial/tongue/throat swelling, SOB or lightheadedness with hypotension: Yes Has patient had a PCN reaction causing severe rash involving mucus membranes or skin necrosis: No Has patient had a PCN reaction that required hospitalization No Has patient had a PCN reaction occurring within the last 10 years: No If all of the above answers are "NO", then may proceed with Cephalosporin use.  . Rivaroxaban Rash    Current Medications: Current Outpatient Medications  Medication Sig Dispense Refill  . meclizine (ANTIVERT) 25 MG tablet Take 1 tablet by mouth three times daily as needed for dizziness    . amLODipine (NORVASC) 10 MG tablet Take 10 mg by mouth daily.    . Ascorbic Acid (VITAMIN C) 100 MG tablet Take by mouth daily.    Marland Kitchen aspirin 81 MG chewable tablet Chew 1 tablet (81 mg total) by mouth daily. 30 tablet 0  . benazepril (LOTENSIN) 40 MG tablet Take 40 mg by mouth daily.    . calcium carbonate (OSCAL) 1500 (600 Ca) MG TABS tablet Take 1,500 mg by mouth 2 (two) times daily with a meal.    . cholecalciferol (VITAMIN D) 1000 units tablet Take 1,000 Units by mouth daily.    . fluticasone (FLONASE) 50 MCG/ACT nasal spray Place 1 spray into both nostrils 2 (two) times  daily.    Marland Kitchen levothyroxine (SYNTHROID) 25 MCG tablet Take 25 mcg by mouth daily before breakfast.    . lovastatin (MEVACOR) 40 MG tablet Take 40 mg by mouth at bedtime.    . naproxen (NAPROSYN) 500 MG tablet Take 500 mg by mouth 2 (two) times daily with a meal.    . omeprazole (PRILOSEC) 20 MG capsule Take 20 mg by mouth daily.    Marland Kitchen zinc sulfate 220 (50 Zn) MG capsule Take 220 mg by mouth daily.     No current facility-administered medications for this visit.     ASSESSMENT & PLAN:   Assessment:   1. Stage IA triple negative breast cancer diagnosed in February 2018, treated with surgery and adjuvant radiation.  She declined adjuvant chemotherapy due to age.  She remains without evidence of recurrence.  2. Compression fracture T10 with significant degenerative disc disease.  She did not undergo kyphoplasty, and instead proceeded with physical therapy for 6 weeks.  Her pain has improved and she is able to do her daily activities again.  3. Osteoporosis with her last bone density scan done in May 2021.  She has been getting these through her primary care provider.  She has declined treatment for her bones, but is on oral calcium and vitamin D.  4. History of colon polyps.  It has been over 10 years, so I recommended repeat colonoscopy.  She has not done this yet.  She states she sees Dr. Lyndel Safe in [facility].  5. History of skin cancers.  6. Former smoker.  Plan: We will plan to see her back in 6 months for repeat examination.  If all is well following that appointment we will go to yearly visits in order to alternative with Dr. Coralie Keens who sees her in the spring.  She verbalizes understanding of and agreement to the plans discussed today. She knows to call the office should any new questions or concerns arise.    I, Rita Ohara, am acting as scribe for Derwood Kaplan, MD  I have reviewed this report as typed by the medical scribe, and it is complete and accurate.  Hermina Barters

## 2020-09-01 ENCOUNTER — Telehealth: Payer: Self-pay | Admitting: Oncology

## 2020-09-01 ENCOUNTER — Encounter: Payer: Self-pay | Admitting: Oncology

## 2020-09-01 ENCOUNTER — Other Ambulatory Visit: Payer: Self-pay

## 2020-09-01 ENCOUNTER — Inpatient Hospital Stay: Payer: Medicare HMO | Attending: Hematology and Oncology | Admitting: Oncology

## 2020-09-01 VITALS — BP 151/70 | HR 76 | Temp 97.8°F | Resp 18 | Ht 61.5 in | Wt 182.0 lb

## 2020-09-01 DIAGNOSIS — M810 Age-related osteoporosis without current pathological fracture: Secondary | ICD-10-CM | POA: Insufficient documentation

## 2020-09-01 DIAGNOSIS — I82401 Acute embolism and thrombosis of unspecified deep veins of right lower extremity: Secondary | ICD-10-CM

## 2020-09-01 DIAGNOSIS — S22000A Wedge compression fracture of unspecified thoracic vertebra, initial encounter for closed fracture: Secondary | ICD-10-CM | POA: Diagnosis not present

## 2020-09-01 DIAGNOSIS — Z171 Estrogen receptor negative status [ER-]: Secondary | ICD-10-CM | POA: Diagnosis not present

## 2020-09-01 DIAGNOSIS — C50411 Malignant neoplasm of upper-outer quadrant of right female breast: Secondary | ICD-10-CM | POA: Diagnosis not present

## 2020-09-01 NOTE — Telephone Encounter (Signed)
Per 3/14 LOS, CX May Appt - scheduled Sept Appt's.  Gave patient Appt Summary

## 2020-10-29 ENCOUNTER — Ambulatory Visit: Payer: Medicare HMO | Admitting: Oncology

## 2021-02-25 NOTE — Progress Notes (Signed)
[facility]  [address] [place],  [place]  [id number] [phone]  Clinic Day:  03/04/2021  Referring physician: Maggie Schwalbe, PA-C  This document serves as a record of services personally performed by Hosie Poisson, MD. It was created on their behalf by Curry,Lauren E, a trained medical scribe. The creation of this record is based on the scribe's personal observations and the provider's statements to them.  CHIEF COMPLAINT:  CC: History of stage IA triple negative breast cancer   Current Treatment:  Surveillance  HISTORY OF PRESENT ILLNESS:  Erin Oliver is a 85 y.o. female with a history of stage IA (T1c N0 M0) triple negative right breast cancer diagnosed in February 2018.  She underwent lumpectomy and sentinel node biopsy.  Pathology revealed an 11 mm, grade 3, invasive ductal carcinoma with high-grade ductal carcinoma in situ.  Two lymph nodes were negative for metastasis.  Estrogen and progesterone receptors were negative and her 2 Neu negative.  Ki 67 was 80%.  She declined adjuvant chemotherapy.  She did undergo adjuvant radiation therapy to the right breast completed in May 2018.  She had cellulitis of the right breast right after completing radiation therapy with drainage from the breast.  She required multiple courses of antibiotics.  In September 2018, she fell and broke multiple ribs.  Evaluation in the emergency department with CT chest revealed nondisplaced right anterior rib fractures as well as a 2 cm nodular density within the right middle lobe.  Follow-up of the lung nodule in 3 months was recommended.  The patient is a former smoker, but quit 40 years ago.  Repeat CT chest was done in December 2018, at that time, radiation changes in the right middle lobe were felt to explain for nodular density seen on previous CT.  No further follow-up was recommended.  Dr. Holly Bodily obtained a bone density scan in February 2019, which  revealed osteopenia with a T-score -1.8 in the spine and a T-score -2.4 in the femur.  The patient had previously been on oral bisphosphonates, but did not tolerate these.  We offered the options of Prolia or Reclast, but she did not really wish to try either of these.  Bilateral diagnostic mammogram in February 2019 did not reveal any evidence of malignancy.  She had a skin cancer removed from her right face in July 2019.  She had a colonoscopy in August 2008 with findings of a 4 mm small sessile polyp of the rectum, 10 cm from the anal verge.   CT abdomen in early March 2020 revealed a new mild compression fracture of T10, and this was the area where she complained of severe pain, as well as her right rib cage.  We recommended an MRI thoracic spine scan, which confirmed a compression fracture.  We referred her for consultation with Interventional Radiology after the MRI scan, for possible kyphoplasty.  They did not feel she needed this procedure and was concerned that the pain was in the lumbar spine at that time, and radiating down her leg.  MRI lumbar spine was abnormal, but did not show fracture or evidence of metastatic disease.  She also saw Dr. Newman Pies, surgeon, in [place].  She was found to have deep venous thrombosis of the right lower extremity and August and was placed on Xarelto.  Review of her hospital record revealed the MRI thoracic spine to show old healed mild compression fractures of T6 and T10, with new facet arthritis at  T8-9 and T9-10, left greater than right, without neural impingement or foraminal stenosis.    Bone density from May 2021 revealed osteoporosis with a T-score of -3.3 of the left forearm.  Right femur neck is stable at -2.4, and dual femur total mean measures -2.0, previously -1.8. Bilateral diagnostic mammogram from March 2022 was clear.    INTERVAL HISTORY:  Erin Oliver is here for routine follow up and states that she has been fairly well. She continues to report  chronic back pain. She did have a fall recently, and notes pain of the left arm. Otherwise, she has been well. She is scheduled for blood work with Dr. Clint Bolder in the near future to check her A1c and triglycerides. Blood counts and chemistries are unremarkable. Her  appetite is good, and she has lost nearly 3 pounds since her last visit. She denies fever, chills or other signs of infection. She denies nausea, vomiting, bowel issues, or abdominal pain. She denies sore throat, cough, dyspnea, or chest pain.  REVIEW OF SYSTEMS:  Review of Systems  Constitutional: Negative.  Negative for appetite change, chills, fatigue, fever and unexpected weight change.  HENT:  Negative.    Eyes: Negative.   Respiratory: Negative.  Negative for chest tightness, cough, hemoptysis, shortness of breath and wheezing.   Cardiovascular: Negative.  Negative for chest pain, leg swelling and palpitations.  Gastrointestinal: Negative.  Negative for abdominal distention, abdominal pain, blood in stool, constipation, diarrhea, nausea and vomiting.  Endocrine: Negative.   Genitourinary: Negative.  Negative for difficulty urinating, dysuria, frequency and hematuria.   Musculoskeletal:  Positive for arthralgias and back pain (chronic). Negative for flank pain, gait problem and myalgias.  Skin: Negative.   Neurological: Negative.  Negative for dizziness, extremity weakness, gait problem, headaches, light-headedness, numbness, seizures and speech difficulty.  Hematological: Negative.   Psychiatric/Behavioral: Negative.  Negative for depression and sleep disturbance. The patient is not nervous/anxious.     VITALS:  Blood pressure (!) 184/81, pulse 67, temperature 98.7 F (37.1 C), temperature source Oral, resp. rate 20, height 5' 1.5" (1.562 m), weight 176 lb 11.2 oz (80.2 kg), SpO2 95 %.  Wt Readings from Last 3 Encounters:  03/04/21 176 lb 11.2 oz (80.2 kg)  09/01/20 182 lb (82.6 kg)  08/11/20 179 lb 6.4 oz (81.4 kg)    Body  mass index is 32.85 kg/m.  Performance status (ECOG): 1 - Symptomatic but completely ambulatory  PHYSICAL EXAM:  Physical Exam Constitutional:      General: She is not in acute distress.    Appearance: Normal appearance. She is normal weight.  HENT:     Head: Normocephalic and atraumatic.  Eyes:     General: No scleral icterus.    Extraocular Movements: Extraocular movements intact.     Conjunctiva/sclera: Conjunctivae normal.     Pupils: Pupils are equal, round, and reactive to light.  Cardiovascular:     Rate and Rhythm: Normal rate and regular rhythm.     Pulses: Normal pulses.     Heart sounds: Normal heart sounds. No murmur heard.   No friction rub. No gallop.  Pulmonary:     Effort: Pulmonary effort is normal. No respiratory distress.     Breath sounds: Normal breath sounds.  Chest:     Comments: Large deep scar of the lateral right breast. It is tender. No masses in either breast. Abdominal:     General: Bowel sounds are normal. There is no distension.     Palpations: Abdomen is soft. There  is no hepatomegaly, splenomegaly or mass.     Tenderness: There is no abdominal tenderness.  Musculoskeletal:        General: Normal range of motion.     Cervical back: Normal range of motion and neck supple.     Right lower leg: No edema.     Left lower leg: No edema.  Lymphadenopathy:     Cervical: No cervical adenopathy.  Skin:    General: Skin is warm and dry.  Neurological:     General: No focal deficit present.     Mental Status: She is alert and oriented to person, place, and time. Mental status is at baseline.  Psychiatric:        Mood and Affect: Mood normal.        Behavior: Behavior normal.        Thought Content: Thought content normal.        Judgment: Judgment normal.    LABS:   CBC Latest Ref Rng & Units 03/04/2021 08/11/2020 11/01/2015  WBC - 6.4 6.9 6.4  Hemoglobin 12.0 - 16.0 12.9 13.6 13.1  Hematocrit 36 - 46 39 41 40.8  Platelets 150 - 399 328 380 355    CMP Latest Ref Rng & Units 03/04/2021 08/11/2020 11/01/2015  Glucose 65 - 99 mg/dL - - 92  BUN 4 - 21 16 25(A) 19  Creatinine 0.5 - 1.1 1.1 1.2(A) 1.33(H)  Sodium 137 - 147 138 138 140  Potassium 3.4 - 5.3 4.5 4.3 3.8  Chloride 99 - 108 106 104 105  CO2 13 - 22 22 26(A) 26  Calcium 8.7 - 10.7 8.9 8.9 9.1  Total Protein 6.5 - 8.1 g/dL - - 6.9  Total Bilirubin 0.3 - 1.2 mg/dL - - 0.6  Alkaline Phos 25 - 125 61 72 55  AST 13 - 35 '28 31 25  '$ ALT 7 - 35 '14 19 20     '$ STUDIES:    HISTORY:   Allergies:  Allergies  Allergen Reactions   Hydrocodone-Acetaminophen     Other reaction(s): Mental Status Changes (intolerance)   Penicillins Hives    Has patient had a PCN reaction causing immediate rash, facial/tongue/throat swelling, SOB or lightheadedness with hypotension: Yes Has patient had a PCN reaction causing severe rash involving mucus membranes or skin necrosis: No Has patient had a PCN reaction that required hospitalization No Has patient had a PCN reaction occurring within the last 10 years: No If all of the above answers are "NO", then may proceed with Cephalosporin use.  Other reaction(s): Hives Has patient had a PCN reaction causing immediate rash, facial/tongue/throat swelling, SOB or lightheadedness with hypotension: Yes Has patient had a PCN reaction causing severe rash involving mucus membranes or skin necrosis: No Has patient had a PCN reaction that required hospitalization No Has patient had a PCN reaction occurring within the last 10 years: No If all of the above answers are "NO", then may proceed with Cephalosporin use. Has patient had a PCN reaction causing immediate rash, facial/tongue/throat swelling, SOB or lightheadedness with hypotension: Yes Has patient had a PCN reaction causing severe rash involving mucus membranes or skin necrosis: No Has patient had a PCN reaction that required hospitalization No Has patient had a PCN reaction occurring within the last 10  years: No If all of the above answers are "NO", then may proceed with Cephalosporin use.   Rivaroxaban Rash    Current Medications: Current Outpatient Medications  Medication Sig Dispense Refill   bisoprolol (ZEBETA)  5 MG tablet Take 1 tablet by mouth daily.     amLODipine (NORVASC) 10 MG tablet Take 10 mg by mouth daily.     Ascorbic Acid (VITAMIN C) 100 MG tablet Take by mouth daily.     aspirin 81 MG chewable tablet Chew 1 tablet (81 mg total) by mouth daily. 30 tablet 0   benazepril (LOTENSIN) 40 MG tablet Take 40 mg by mouth daily.     calcium carbonate (OSCAL) 1500 (600 Ca) MG TABS tablet Take 1,500 mg by mouth 2 (two) times daily with a meal.     cholecalciferol (VITAMIN D) 1000 units tablet Take 1,000 Units by mouth daily.     fluticasone (FLONASE) 50 MCG/ACT nasal spray Place 1 spray into both nostrils 2 (two) times daily.     levothyroxine (SYNTHROID) 25 MCG tablet Take 25 mcg by mouth daily before breakfast.     lovastatin (MEVACOR) 40 MG tablet Take 40 mg by mouth at bedtime.     meclizine (ANTIVERT) 25 MG tablet Take 1 tablet by mouth three times daily as needed for dizziness     naproxen (NAPROSYN) 500 MG tablet Take 500 mg by mouth 2 (two) times daily with a meal.     omeprazole (PRILOSEC) 20 MG capsule Take 20 mg by mouth daily.     zinc sulfate 220 (50 Zn) MG capsule Take 220 mg by mouth daily.     No current facility-administered medications for this visit.     ASSESSMENT & PLAN:   Assessment:   1. Stage IA triple negative breast cancer diagnosed in February 2018, treated with surgery and adjuvant radiation.  She declined adjuvant chemotherapy due to age.  She remains without evidence of recurrence.  2. Compression fracture T10 with significant degenerative disc disease.  She did not undergo kyphoplasty, and instead proceeded with physical therapy for 6 weeks.  Her pain has improved and she is able to do her daily activities again.  3. Osteoporosis with her last  bone density scan done in May 2021.  She has been getting these through her primary care provider.  She has declined treatment for her bones, but is on oral calcium and vitamin D.  4. History of colon polyps.  It has been over 10 years, so I recommended repeat colonoscopy.  She has not done this yet.  She states she sees Dr. Lyndel Safe in [facility].  5. History of skin cancers.  6. Former smoker.  Plan: I wrote her a prescription for specialty bras and breast prosthesis and gave her a brochure for [facility] [place]. We will plan to see her back in 1 year for repeat examination to alternative with Dr. Coralie Keens who sees her in the spring.  Annual mammogram is scheduled for March. She verbalizes understanding of and agreement to the plans discussed today. She knows to call the office should any new questions or concerns arise.    I, Rita Ohara, am acting as scribe for Derwood Kaplan, MD  I have reviewed this report as typed by the medical scribe, and it is complete and accurate.

## 2021-03-04 ENCOUNTER — Telehealth: Payer: Self-pay | Admitting: Oncology

## 2021-03-04 ENCOUNTER — Inpatient Hospital Stay (INDEPENDENT_AMBULATORY_CARE_PROVIDER_SITE_OTHER): Payer: Medicare HMO | Admitting: Oncology

## 2021-03-04 ENCOUNTER — Inpatient Hospital Stay: Payer: Medicare HMO | Attending: Oncology

## 2021-03-04 ENCOUNTER — Encounter: Payer: Self-pay | Admitting: Oncology

## 2021-03-04 ENCOUNTER — Other Ambulatory Visit: Payer: Self-pay | Admitting: Oncology

## 2021-03-04 VITALS — BP 184/81 | HR 67 | Temp 98.7°F | Resp 20 | Ht 61.5 in | Wt 176.7 lb

## 2021-03-04 DIAGNOSIS — Z171 Estrogen receptor negative status [ER-]: Secondary | ICD-10-CM

## 2021-03-04 DIAGNOSIS — C50411 Malignant neoplasm of upper-outer quadrant of right female breast: Secondary | ICD-10-CM

## 2021-03-04 LAB — HEPATIC FUNCTION PANEL
ALT: 14 (ref 7–35)
AST: 28 (ref 13–35)
Alkaline Phosphatase: 61 (ref 25–125)
Bilirubin, Total: 0.4

## 2021-03-04 LAB — BASIC METABOLIC PANEL
BUN: 16 (ref 4–21)
CO2: 22 (ref 13–22)
Chloride: 106 (ref 99–108)
Creatinine: 1.1 (ref 0.5–1.1)
Glucose: 104
Potassium: 4.5 (ref 3.4–5.3)
Sodium: 138 (ref 137–147)

## 2021-03-04 LAB — COMPREHENSIVE METABOLIC PANEL
Albumin: 4 (ref 3.5–5.0)
Calcium: 8.9 (ref 8.7–10.7)

## 2021-03-04 LAB — CBC: RBC: 4.34 (ref 3.87–5.11)

## 2021-03-04 LAB — CBC AND DIFFERENTIAL
HCT: 39 (ref 36–46)
Hemoglobin: 12.9 (ref 12.0–16.0)
Neutrophils Absolute: 3.71
Platelets: 328 (ref 150–399)
WBC: 6.4

## 2021-03-04 NOTE — Telephone Encounter (Signed)
Per 9/14 LOS next appt scheduled and given to patient

## 2022-03-02 NOTE — Progress Notes (Signed)
[place]  [address] [place],  [place]  [address] [phone]  Clinic Day:  03/04/2022  Referring physician: Maggie Schwalbe, PA-C  CHIEF COMPLAINT:  CC: History of stage IA triple negative breast cancer   Current Treatment:  Surveillance  HISTORY OF PRESENT ILLNESS:  Erin Oliver is a 86 y.o. female with a history of stage IA (T1c N0 M0) triple negative right breast cancer diagnosed in February 2018.  She underwent lumpectomy and sentinel node biopsy.  Pathology revealed an 11 mm, grade 3, invasive ductal carcinoma with high-grade ductal carcinoma in situ.  Two lymph nodes were negative for metastasis.  Estrogen and progesterone receptors were negative and her 2 Neu negative.  Ki 67 was 80%.  She declined adjuvant chemotherapy.  She did undergo adjuvant radiation therapy to the right breast completed in May 2018.  She had cellulitis of the right breast right after completing radiation therapy with drainage from the breast.  She required multiple courses of antibiotics.  In September 2018, she fell and broke multiple ribs.  Evaluation in the emergency department with CT chest revealed nondisplaced right anterior rib fractures as well as a 2 cm nodular density within the right middle lobe.  Follow-up of the lung nodule in 3 months was recommended.  The patient is a former smoker, but quit 40 years ago.  Repeat CT chest was done in December 2018, at that time, radiation changes in the right middle lobe were felt to explain for nodular density seen on previous CT.  No further follow-up was recommended.  Dr. Holly Bodily obtained a bone density scan in February 2019, which revealed osteopenia with a T-score -1.8 in the spine and a T-score -2.4 in the femur.  The patient had previously been on oral bisphosphonates, but did not tolerate these.  We offered the options of Prolia or Reclast, but she did not really wish to try either of these.  Bilateral diagnostic  mammogram in February 2019 did not reveal any evidence of malignancy.  She had a skin cancer removed from her right face in July 2019.  She had a colonoscopy in August 2008 with findings of a 4 mm small sessile polyp of the rectum, 10 cm from the anal verge.   CT abdomen in early March 2020 revealed a new mild compression fracture of T10, and this was the area where she complained of severe pain, as well as her right rib cage.  We recommended an MRI thoracic spine scan, which confirmed a compression fracture.  We referred her for consultation with Interventional Radiology after the MRI scan, for possible kyphoplasty.  They did not feel she needed this procedure and was concerned that the pain was in the lumbar spine at that time, and radiating down her leg.  MRI lumbar spine was abnormal, but did not show fracture or evidence of metastatic disease.  She also saw Dr. Newman Pies, surgeon, in [place].  She was found to have deep venous thrombosis of the right lower extremity and August and was placed on Xarelto.  Review of her hospital record revealed the MRI thoracic spine to show old healed mild compression fractures of T6 and T10, with new facet arthritis at T8-9 and T9-10, left greater than right, without neural impingement or foraminal stenosis.    Bone density from May 2021 revealed osteoporosis with a T-score of -3.3 of the left forearm.  Right femur neck is stable at -2.4, and dual femur total mean measures -  2.0, previously -1.8. Bilateral diagnostic mammogram from March 2022 was clear.    INTERVAL HISTORY:  Erin Oliver is here for routine follow up and states that she has been fairly well. She notes that she is active and working outside her garden. She notes that her back pain continues to fluctuate but she does not take anything to help.  We will schedule her annual bilateral mammogram in March and she asks why she needs to see Dr.Moorhead also. I agree to take over her annual check ups and  mammograms.Blood counts and chemistries are unremarkable. Her  appetite is good, and she has lost 7 pounds since her last visit. She denies fever, chills or other signs of infection. She denies nausea, vomiting, bowel issues, or abdominal pain. She denies sore throat, cough, dyspnea, or chest pain.  REVIEW OF SYSTEMS:  Review of Systems  Constitutional: Negative.  Negative for appetite change, chills, fatigue, fever and unexpected weight change.  HENT:  Negative.    Eyes: Negative.   Respiratory: Negative.  Negative for chest tightness, cough, hemoptysis, shortness of breath and wheezing.   Cardiovascular: Negative.  Negative for chest pain, leg swelling and palpitations.  Gastrointestinal: Negative.  Negative for abdominal distention, abdominal pain, blood in stool, constipation, diarrhea, nausea and vomiting.  Endocrine: Negative.   Genitourinary: Negative.  Negative for difficulty urinating, dysuria, frequency and hematuria.   Musculoskeletal:  Positive for arthralgias and back pain (chronic). Negative for flank pain, gait problem and myalgias.  Skin: Negative.   Neurological: Negative.  Negative for dizziness, extremity weakness, gait problem, headaches, light-headedness, numbness, seizures and speech difficulty.  Hematological: Negative.   Psychiatric/Behavioral: Negative.  Negative for depression and sleep disturbance. The patient is not nervous/anxious.      VITALS:  Blood pressure (!) 172/79, pulse 61, temperature (!) 97.5 F (36.4 C), temperature source Oral, resp. rate 16, height 5' 1.5" (1.562 m), weight 169 lb 8 oz (76.9 kg), SpO2 96 %.  Wt Readings from Last 3 Encounters:  03/04/22 169 lb 8 oz (76.9 kg)  03/04/21 176 lb 11.2 oz (80.2 kg)  09/01/20 182 lb (82.6 kg)    Body mass index is 31.51 kg/m.  Performance status (ECOG): 1 - Symptomatic but completely ambulatory  PHYSICAL EXAM:  Physical Exam Constitutional:      General: She is not in acute distress.     Appearance: Normal appearance. She is normal weight. She is not ill-appearing or toxic-appearing.  HENT:     Head: Normocephalic and atraumatic.  Eyes:     General: No scleral icterus.    Extraocular Movements: Extraocular movements intact.     Conjunctiva/sclera: Conjunctivae normal.     Pupils: Pupils are equal, round, and reactive to light.  Cardiovascular:     Rate and Rhythm: Normal rate and regular rhythm.     Pulses: Normal pulses.     Heart sounds: Normal heart sounds. No murmur heard.    No friction rub. No gallop.  Pulmonary:     Effort: Pulmonary effort is normal. No respiratory distress.     Breath sounds: Normal breath sounds. No wheezing or rales.  Chest:  Breasts:    Right: No mass.     Left: No mass.     Comments: Extensive scar lower outer quadrant in right breast but all well healed. No masses in either breast. Abdominal:     General: Bowel sounds are normal. There is no distension.     Palpations: Abdomen is soft. There is no hepatomegaly, splenomegaly  or mass.     Tenderness: There is no abdominal tenderness.  Musculoskeletal:        General: Normal range of motion.     Cervical back: Normal range of motion and neck supple.     Right lower leg: No edema.     Left lower leg: No edema.  Lymphadenopathy:     Cervical: No cervical adenopathy.  Skin:    General: Skin is warm and dry.  Neurological:     General: No focal deficit present.     Mental Status: She is alert and oriented to person, place, and time. Mental status is at baseline.  Psychiatric:        Mood and Affect: Mood normal.        Behavior: Behavior normal.        Thought Content: Thought content normal.        Judgment: Judgment normal.     LABS:      Latest Ref Rng & Units 03/04/2022   12:00 AM 03/04/2021   12:00 AM 08/11/2020   12:00 AM  CBC  WBC  6.5     6.4  6.9      Hemoglobin 12.0 - 16.0 14.1     12.9  13.6      Hematocrit 36 - 46 42     39  41      Platelets 150 - 400 K/uL 372      328  380         This result is from an external source.      Latest Ref Rng & Units 03/04/2022   12:00 AM 03/04/2021   12:00 AM 08/11/2020   12:00 AM  CMP  BUN 4 - '21 21     16  25      '$ Creatinine 0.5 - 1.1 1.1     1.1  1.2      Sodium 137 - 147 138     138  138      Potassium 3.5 - 5.1 mEq/L 4.2     4.5  4.3      Chloride 99 - 108 106     106  104      CO2 13 - '22 27     22  26      '$ Calcium 8.7 - 10.7 9.2     8.9  8.9      Alkaline Phos 25 - 125 68     61  72      AST 13 - 35 '24     28  31      '$ ALT 7 - 35 U/L '18     14  19         '$ This result is from an external source.     STUDIES:    HISTORY:   Allergies:  Allergies  Allergen Reactions   Hydrocodone-Acetaminophen     Other reaction(s): Mental Status Changes (intolerance)   Penicillins Hives    Has patient had a PCN reaction causing immediate rash, facial/tongue/throat swelling, SOB or lightheadedness with hypotension: Yes Has patient had a PCN reaction causing severe rash involving mucus membranes or skin necrosis: No Has patient had a PCN reaction that required hospitalization No Has patient had a PCN reaction occurring within the last 10 years: No If all of the above answers are "NO", then may proceed with Cephalosporin use.  Other reaction(s): Hives Has patient had a PCN reaction causing immediate rash, facial/tongue/throat swelling,  SOB or lightheadedness with hypotension: Yes Has patient had a PCN reaction causing severe rash involving mucus membranes or skin necrosis: No Has patient had a PCN reaction that required hospitalization No Has patient had a PCN reaction occurring within the last 10 years: No If all of the above answers are "NO", then may proceed with Cephalosporin use. Has patient had a PCN reaction causing immediate rash, facial/tongue/throat swelling, SOB or lightheadedness with hypotension: Yes Has patient had a PCN reaction causing severe rash involving mucus membranes or skin necrosis:  No Has patient had a PCN reaction that required hospitalization No Has patient had a PCN reaction occurring within the last 10 years: No If all of the above answers are "NO", then may proceed with Cephalosporin use.   Rivaroxaban Rash    Current Medications: Current Outpatient Medications  Medication Sig Dispense Refill   amLODipine (NORVASC) 10 MG tablet Take 10 mg by mouth daily.     Ascorbic Acid (VITAMIN C) 100 MG tablet Take by mouth daily.     aspirin 81 MG chewable tablet Chew 1 tablet (81 mg total) by mouth daily. 30 tablet 0   benazepril (LOTENSIN) 40 MG tablet Take 40 mg by mouth daily.     bisoprolol (ZEBETA) 5 MG tablet Take 1 tablet by mouth daily.     calcium carbonate (OSCAL) 1500 (600 Ca) MG TABS tablet Take 1,500 mg by mouth 2 (two) times daily with a meal.     cholecalciferol (VITAMIN D) 1000 units tablet Take 1,000 Units by mouth daily.     fluticasone (FLONASE) 50 MCG/ACT nasal spray Place 1 spray into both nostrils 2 (two) times daily.     levothyroxine (SYNTHROID) 25 MCG tablet Take 25 mcg by mouth daily before breakfast.     lovastatin (MEVACOR) 40 MG tablet Take 40 mg by mouth at bedtime.     meclizine (ANTIVERT) 25 MG tablet Take 1 tablet by mouth three times daily as needed for dizziness     omeprazole (PRILOSEC) 20 MG capsule Take 20 mg by mouth daily.     zinc sulfate 220 (50 Zn) MG capsule Take 220 mg by mouth daily.     No current facility-administered medications for this visit.     ASSESSMENT & PLAN:   Assessment:   1. Stage IA triple negative breast cancer diagnosed in February 2018, treated with surgery and adjuvant radiation.  She declined adjuvant chemotherapy due to age.  She remains without evidence of recurrence.  2. Compression fracture T10 with significant degenerative disc disease.  She did not undergo kyphoplasty, and instead proceeded with physical therapy for 6 weeks.  Her pain has improved and she is able to do her daily activities  again.  3. Osteoporosis with her last bone density scan done in May 2021.  She has been getting these through her primary care provider.  She has declined treatment for her bones, but is on oral calcium and vitamin D.  4. History of colon polyps.  It has been over 10 years, so I recommended repeat colonoscopy.  She has not done this yet.  She states she sees Dr. Lyndel Safe in [facility].  5. History of skin cancers.  6. Former smoker.  Plan:  We will plan to see her back in 6 months  for repeat examination with annual bilateral mammogram which will be scheduled for March. We will then go to annual follow ups. We feel that Dr.Moorhead's visit may be a duplication of services. She verbalizes  understanding of and agreement to the plans discussed today. She knows to call the office should any new questions or concerns arise.     I,Gabriella Ballesteros,acting as a scribe for Derwood Kaplan, MD.,have documented all relevant documentation on the behalf of Derwood Kaplan, MD,as directed by  Derwood Kaplan, MD while in the presence of Derwood Kaplan, MD.   I have reviewed this report as typed by the medical scribe, and it is complete and accurate.

## 2022-03-03 ENCOUNTER — Other Ambulatory Visit: Payer: Self-pay | Admitting: Oncology

## 2022-03-03 DIAGNOSIS — C50411 Malignant neoplasm of upper-outer quadrant of right female breast: Secondary | ICD-10-CM

## 2022-03-04 ENCOUNTER — Other Ambulatory Visit: Payer: Self-pay | Admitting: Oncology

## 2022-03-04 ENCOUNTER — Encounter: Payer: Self-pay | Admitting: Oncology

## 2022-03-04 ENCOUNTER — Inpatient Hospital Stay: Payer: Medicare HMO | Attending: Oncology | Admitting: Oncology

## 2022-03-04 ENCOUNTER — Inpatient Hospital Stay: Payer: Medicare HMO

## 2022-03-04 VITALS — BP 172/79 | HR 61 | Temp 97.5°F | Resp 16 | Ht 61.5 in | Wt 169.5 lb

## 2022-03-04 DIAGNOSIS — Z1239 Encounter for other screening for malignant neoplasm of breast: Secondary | ICD-10-CM

## 2022-03-04 DIAGNOSIS — C50411 Malignant neoplasm of upper-outer quadrant of right female breast: Secondary | ICD-10-CM | POA: Diagnosis not present

## 2022-03-04 DIAGNOSIS — Z171 Estrogen receptor negative status [ER-]: Secondary | ICD-10-CM | POA: Diagnosis not present

## 2022-03-04 LAB — BASIC METABOLIC PANEL
BUN: 21 (ref 4–21)
CO2: 27 — AB (ref 13–22)
Chloride: 106 (ref 99–108)
Creatinine: 1.1 (ref 0.5–1.1)
Glucose: 104
Potassium: 4.2 mEq/L (ref 3.5–5.1)
Sodium: 138 (ref 137–147)

## 2022-03-04 LAB — CBC AND DIFFERENTIAL
HCT: 42 (ref 36–46)
Hemoglobin: 14.1 (ref 12.0–16.0)
Neutrophils Absolute: 4.23
Platelets: 372 10*3/uL (ref 150–400)
WBC: 6.5

## 2022-03-04 LAB — HEPATIC FUNCTION PANEL
ALT: 18 U/L (ref 7–35)
AST: 24 (ref 13–35)
Alkaline Phosphatase: 68 (ref 25–125)
Bilirubin, Total: 0.5

## 2022-03-04 LAB — COMPREHENSIVE METABOLIC PANEL
Albumin: 4.2 (ref 3.5–5.0)
Calcium: 9.2 (ref 8.7–10.7)

## 2022-03-04 LAB — CBC: RBC: 4.65 (ref 3.87–5.11)

## 2022-08-23 LAB — HM MAMMOGRAPHY

## 2022-08-30 ENCOUNTER — Other Ambulatory Visit: Payer: Self-pay | Admitting: Oncology

## 2022-08-30 ENCOUNTER — Inpatient Hospital Stay: Payer: Medicare HMO | Attending: Oncology | Admitting: Oncology

## 2022-08-30 ENCOUNTER — Encounter: Payer: Self-pay | Admitting: Oncology

## 2022-08-30 ENCOUNTER — Telehealth: Payer: Self-pay | Admitting: Oncology

## 2022-08-30 VITALS — BP 151/69 | HR 66 | Temp 98.1°F | Resp 18 | Ht 61.5 in | Wt 174.8 lb

## 2022-08-30 DIAGNOSIS — C50411 Malignant neoplasm of upper-outer quadrant of right female breast: Secondary | ICD-10-CM | POA: Diagnosis not present

## 2022-08-30 DIAGNOSIS — Z171 Estrogen receptor negative status [ER-]: Secondary | ICD-10-CM

## 2022-08-30 DIAGNOSIS — M810 Age-related osteoporosis without current pathological fracture: Secondary | ICD-10-CM | POA: Diagnosis not present

## 2022-08-30 NOTE — Telephone Encounter (Signed)
08/30/22 next appt scheduled and confirmed with patient

## 2022-08-30 NOTE — Progress Notes (Signed)
[name]  [address] [place],  [place]  [address] [phone]  Clinic Day:  08/30/22   Referring physician: Maggie Schwalbe, PA-C  CHIEF COMPLAINT:  CC: History of stage IA triple negative breast cancer   Current Treatment:  Surveillance  HISTORY OF PRESENT ILLNESS:  Erin Oliver is a 87 y.o. female with a history of stage IA (T1c N0 M0) triple negative right breast cancer diagnosed in February 2018.  She underwent lumpectomy and sentinel node biopsy.  Pathology revealed an 11 mm, grade 3, invasive ductal carcinoma with high-grade ductal carcinoma in situ.  Two lymph nodes were negative for metastasis.  Estrogen and progesterone receptors were negative and her 2 Neu negative.  Ki 67 was 80%.  She declined adjuvant chemotherapy.  She did undergo adjuvant radiation therapy to the right breast completed in May 2018.  She had cellulitis of the right breast right after completing radiation therapy with drainage from the breast.  She required multiple courses of antibiotics.  In September 2018, she fell and broke multiple ribs.  Evaluation in the emergency department with CT chest revealed nondisplaced right anterior rib fractures as well as a 2 cm nodular density within the right middle lobe.  Follow-up of the lung nodule in 3 months was recommended.  The patient is a former smoker, but quit 40 years ago.  Repeat CT chest was done in December 2018, at that time, radiation changes in the right middle lobe were felt to explain for nodular density seen on previous CT.  No further follow-up was recommended.  Dr. Holly Bodily obtained a bone density scan in February 2019, which revealed osteopenia with a T-score -1.8 in the spine and a T-score -2.4 in the femur.  The patient had previously been on oral bisphosphonates, but did not tolerate these.  We offered the options of Prolia or Reclast, but she did not really wish to try either of these.  Bilateral diagnostic  mammogram in February 2019 did not reveal any evidence of malignancy.  She had a skin cancer removed from her right face in July 2019.  She had a colonoscopy in August 2008 with findings of a 4 mm small sessile polyp of the rectum, 10 cm from the anal verge.   CT abdomen in early March 2020 revealed a new mild compression fracture of T10, and this was the area where she complained of severe pain, as well as her right rib cage.  We recommended an MRI thoracic spine scan, which confirmed a compression fracture.  We referred her for consultation with Interventional Radiology after the MRI scan, for possible kyphoplasty.  They did not feel she needed this procedure and was concerned that the pain was in the lumbar spine at that time, and radiating down her leg.  MRI lumbar spine was abnormal, but did not show fracture or evidence of metastatic disease.  She also saw Dr. Newman Pies, surgeon, in [place].  She was found to have deep venous thrombosis of the right lower extremity and August and was placed on Xarelto.  Review of her hospital record revealed the MRI thoracic spine to show old healed mild compression fractures of T6 and T10, with new facet arthritis at T8-9 and T9-10, left greater than right, without neural impingement or foraminal stenosis.    Bone density from May 2021 revealed osteoporosis with a T-score of -3.3 of the left forearm.  Right femur neck is stable at -2.4, and dual femur total mean  measures -2.0, previously -1.8. Bilateral diagnostic mammogram from March 2022 was clear.    INTERVAL HISTORY:  Erin Oliver is here for routine follow up for history of stage IA triple negative breast cancer. Patient states that she feels well and complains of mild pain in her knee. She did have a big fall 08/05/2022 and also landed on her face. She sprained her knee and was covered in bruises, she went to the hospital to be evaluated and was later discharged. Her last mammogram was on 08/23/2022 and revealed  to be clear. I will see her back in 1 year with bilateral screening mammogram. She denies signs of infection such as sore throat, sinus drainage, cough, or urinary symptoms.  She denies fevers or recurrent chills. She denies pain. She denies nausea, vomiting, chest pain, dyspnea or cough. Her appetite is great and her weight has increased 5 pounds over last 5 months   REVIEW OF SYSTEMS:  Review of Systems  Constitutional: Negative.  Negative for appetite change, chills, diaphoresis, fatigue, fever and unexpected weight change.  HENT:  Negative.  Negative for hearing loss, lump/mass, mouth sores, nosebleeds, sore throat, tinnitus, trouble swallowing and voice change.   Eyes: Negative.  Negative for eye problems and icterus.  Respiratory: Negative.  Negative for chest tightness, cough, hemoptysis, shortness of breath and wheezing.   Cardiovascular: Negative.  Negative for chest pain, leg swelling and palpitations.  Gastrointestinal: Negative.  Negative for abdominal distention, abdominal pain, blood in stool, constipation, diarrhea, nausea, rectal pain and vomiting.  Endocrine: Negative.   Genitourinary: Negative.  Negative for bladder incontinence, difficulty urinating, dyspareunia, dysuria, frequency, hematuria, menstrual problem, nocturia, pelvic pain, vaginal bleeding and vaginal discharge.   Musculoskeletal:  Positive for arthralgias (mild right knee pain) and back pain (chronic). Negative for flank pain, gait problem, myalgias, neck pain and neck stiffness.  Skin: Negative.  Negative for itching, rash and wound.  Neurological: Negative.  Negative for dizziness, extremity weakness, gait problem, headaches, light-headedness, numbness, seizures and speech difficulty.  Hematological: Negative.  Negative for adenopathy. Does not bruise/bleed easily.  Psychiatric/Behavioral: Negative.  Negative for confusion, decreased concentration, depression, sleep disturbance and suicidal ideas. The patient is not  nervous/anxious.      VITALS:  Blood pressure (!) 151/69, pulse 66, temperature 98.1 F (36.7 C), temperature source Oral, resp. rate 18, height 5' 1.5" (1.562 m), weight 174 lb 12.8 oz (79.3 kg), SpO2 95 %.  Wt Readings from Last 3 Encounters:  08/30/22 174 lb 12.8 oz (79.3 kg)  03/04/22 169 lb 8 oz (76.9 kg)  03/04/21 176 lb 11.2 oz (80.2 kg)    Body mass index is 32.49 kg/m.  Performance status (ECOG): 1 - Symptomatic but completely ambulatory  PHYSICAL EXAM:  Physical Exam Vitals and nursing note reviewed.  Constitutional:      General: She is not in acute distress.    Appearance: Normal appearance. She is normal weight. She is not ill-appearing, toxic-appearing or diaphoretic.  HENT:     Head: Normocephalic and atraumatic.     Right Ear: Tympanic membrane, ear canal and external ear normal. There is no impacted cerumen.     Left Ear: Tympanic membrane, ear canal and external ear normal. There is no impacted cerumen.     Nose: Nose normal. No congestion or rhinorrhea.     Mouth/Throat:     Mouth: Mucous membranes are moist.     Pharynx: Oropharynx is clear. No oropharyngeal exudate or posterior oropharyngeal erythema.  Eyes:  General: No scleral icterus.       Right eye: No discharge.        Left eye: No discharge.     Extraocular Movements: Extraocular movements intact.     Conjunctiva/sclera: Conjunctivae normal.     Pupils: Pupils are equal, round, and reactive to light.  Neck:     Vascular: No carotid bruit.  Cardiovascular:     Rate and Rhythm: Normal rate and regular rhythm.     Pulses: Normal pulses.     Heart sounds: Normal heart sounds. No murmur heard.    No friction rub. No gallop.  Pulmonary:     Effort: Pulmonary effort is normal. No respiratory distress.     Breath sounds: Normal breath sounds. No stridor. No wheezing, rhonchi or rales.  Chest:     Chest wall: No tenderness.  Breasts:    Right: No mass.     Left: No mass.     Comments: Mild  fibrocystic changes in the both the right and left breasts. Deep scar in the lateral right breast which is well healed and firm. No masses in either breast.  Abdominal:     General: Bowel sounds are normal. There is no distension.     Palpations: Abdomen is soft. There is no hepatomegaly, splenomegaly or mass.     Tenderness: There is no abdominal tenderness. There is no right CVA tenderness, left CVA tenderness, guarding or rebound.     Hernia: No hernia is present.  Musculoskeletal:        General: No swelling, tenderness, deformity or signs of injury. Normal range of motion.     Cervical back: Normal range of motion and neck supple. No rigidity or tenderness.     Right lower leg: No edema.     Left lower leg: No edema.  Lymphadenopathy:     Cervical: No cervical adenopathy.     Right cervical: No superficial, deep or posterior cervical adenopathy.    Left cervical: No superficial, deep or posterior cervical adenopathy.     Upper Body:     Right upper body: No supraclavicular, axillary or pectoral adenopathy.     Left upper body: No supraclavicular, axillary or pectoral adenopathy.  Skin:    General: Skin is warm and dry.     Coloration: Skin is not jaundiced or pale.     Findings: No bruising, erythema, lesion or rash.  Neurological:     General: No focal deficit present.     Mental Status: She is alert and oriented to person, place, and time. Mental status is at baseline.     Cranial Nerves: No cranial nerve deficit.     Sensory: No sensory deficit.     Motor: No weakness.     Coordination: Coordination normal.     Gait: Gait normal.     Deep Tendon Reflexes: Reflexes normal.  Psychiatric:        Mood and Affect: Mood normal.        Behavior: Behavior normal.        Thought Content: Thought content normal.        Judgment: Judgment normal.    LABS:      Latest Ref Rng & Units 03/04/2022   12:00 AM 03/04/2021   12:00 AM 08/11/2020   12:00 AM  CBC  WBC  6.5     6.4  6.9       Hemoglobin 12.0 - 16.0 14.1     12.9  13.6  Hematocrit 36 - 46 42     39  41      Platelets 150 - 400 K/uL 372     328  380         This result is from an external source.      Latest Ref Rng & Units 03/04/2022   12:00 AM 03/04/2021   12:00 AM 08/11/2020   12:00 AM  CMP  BUN 4 - Creatinine 0.5 - 1.1 1.1     1.1  1.2      Sodium 137 - 147 138     138  138      Potassium 3.5 - 5.1 mEq/L 4.2     4.5  4.3      Chloride 99 - 108 106     106  104      CO2 13 - Calcium 8.7 - 10.7 9.2     8.9  8.9      Alkaline Phos 25 - 125 68     61  72      AST 13 - 35 ALT 7 - 35 U/L This result is from an external source.    Ref Range & Units 6 mo ago 02/19/2022  LDL Direct <100 mg/dL 119 High   Total Cholesterol <200 MG/DL 147  Triglycerides <829 MG/DL 94  HDL Cholesterol >=56 MG/DL 48 Low   Total Chol / HDL Cholesterol <4.5 3.5  Non-HDL Cholesterol MG/DL 213    Ref Range & Units 6 mo ago 02/19/2022  HEMOGLOBIN A1C <5.8 % 5.7  eAG MG/DL 086   STUDIES:  Exam: 57/84/6962 Screening Bilateral Mammogram No mammographic evidence of malignancy.     HISTORY:   Allergies:  Allergies  Allergen Reactions   Hydrocodone-Acetaminophen Other (See Comments)    Other reaction(s): Mental Status Changes (intolerance)   Penicillins Hives    Has patient had a PCN reaction causing immediate rash, facial/tongue/throat swelling, SOB or lightheadedness with hypotension: Yes  Has patient had a PCN reaction causing severe rash involving mucus membranes or skin necrosis: No  Has patient had a PCN reaction that required hospitalization No  Has patient had a PCN reaction occurring within the last 10 years: No  If all of the above answers are "NO", then may proceed with Cephalosporin use.  Other reaction(s): Hives  Other reaction(s): Hives, Has patient had a PCN reaction causing immediate rash,  facial/tongue/throat swelling, SOB or lightheadedness with hypotension: Yes, Has patient had a PCN reaction causing severe rash involving mucus membranes or skin necrosis: No, Has patient had a PCN reaction that required hospitalization No, Has patient had a PCN reaction occurring within the last 10 years: No, If all of the above answers are "NO", then may proceed with Cephalosporin use., Has patient had a PCN reaction caus   Rivaroxaban Rash    Current Medications: Current Outpatient Medications  Medication Sig Dispense Refill   amLODipine (NORVASC) 10 MG tablet Take 10 mg by mouth daily.     Ascorbic Acid (VITAMIN C) 100 MG tablet Take by mouth daily.     aspirin 81 MG chewable tablet Chew 1 tablet (81 mg total) by mouth daily.  30 tablet 0   benazepril (LOTENSIN) 40 MG tablet Take 40 mg by mouth daily.     bisoprolol (ZEBETA) 5 MG tablet Take 1 tablet by mouth daily.     calcium carbonate (OSCAL) 1500 (600 Ca) MG TABS tablet Take 1,500 mg by mouth 2 (two) times daily with a meal.     cholecalciferol (VITAMIN D) 1000 units tablet Take 1,000 Units by mouth daily.     fluticasone (FLONASE) 50 MCG/ACT nasal spray Place 1 spray into both nostrils 2 (two) times daily.     levothyroxine (SYNTHROID) 25 MCG tablet Take 25 mcg by mouth daily before breakfast.     lovastatin (MEVACOR) 40 MG tablet Take 40 mg by mouth at bedtime.     meclizine (ANTIVERT) 25 MG tablet Take 1 tablet by mouth three times daily as needed for dizziness     omeprazole (PRILOSEC) 20 MG capsule Take 20 mg by mouth daily.     zinc sulfate 220 (50 Zn) MG capsule Take 220 mg by mouth daily.     No current facility-administered medications for this visit.     ASSESSMENT & PLAN:  Assessment:   1. Stage IA triple negative breast cancer diagnosed in February 2018, treated with surgery and adjuvant radiation.  She declined adjuvant chemotherapy due to age.  She remains without evidence of recurrence.  2. Compression fracture T10  with significant degenerative disc disease.  She did not undergo kyphoplasty, and instead proceeded with physical therapy for 6 weeks.  Her pain has improved and she is able to do her daily activities again.  3. Osteoporosis with her last bone density scan done in May 2021.  She has been getting these through her primary care provider.  She has declined treatment for her bones, but is on oral calcium and vitamin D.  4. History of colon polyps.  It has been over 10 years, so I recommended repeat colonoscopy.  She has not done this yet.  She states she sees Dr. Chales Abrahams in [facility].  5. History of skin cancers.  6. Former smoker.  Plan:  Her last mammogram was on 08/23/2022 and was clear. I will see her back in 1 year for reevaluation with bilateral screening mammogram. She understands and agrees with the plans discussed today. She knows to call the office should any new questions or concerns arise.   I provided 10 minutes of face-to-face time during this encounter and > 50% was spent counseling as documented under my assessment and plan.    I,Jasmine M Lassiter,acting as a scribe for Dellia Beckwith, MD.,have documented all relevant documentation on the behalf of Dellia Beckwith, MD,as directed by  Dellia Beckwith, MD while in the presence of Dellia Beckwith, MD.  I have reviewed this report as typed by the medical scribe, and it is complete and accurate.

## 2022-09-07 ENCOUNTER — Encounter: Payer: Self-pay | Admitting: Oncology

## 2023-08-24 LAB — HM MAMMOGRAPHY

## 2023-08-30 ENCOUNTER — Inpatient Hospital Stay: Payer: Medicare HMO | Attending: Oncology | Admitting: Oncology

## 2023-08-30 ENCOUNTER — Other Ambulatory Visit: Payer: Self-pay | Admitting: Oncology

## 2023-08-30 ENCOUNTER — Telehealth: Payer: Self-pay | Admitting: Oncology

## 2023-08-30 VITALS — BP 150/79 | HR 66 | Temp 97.6°F | Resp 18 | Ht 61.5 in

## 2023-08-30 DIAGNOSIS — M810 Age-related osteoporosis without current pathological fracture: Secondary | ICD-10-CM

## 2023-08-30 DIAGNOSIS — C50411 Malignant neoplasm of upper-outer quadrant of right female breast: Secondary | ICD-10-CM

## 2023-08-30 DIAGNOSIS — M8008XA Age-related osteoporosis with current pathological fracture, vertebra(e), initial encounter for fracture: Secondary | ICD-10-CM | POA: Diagnosis not present

## 2023-08-30 DIAGNOSIS — Z85828 Personal history of other malignant neoplasm of skin: Secondary | ICD-10-CM | POA: Diagnosis not present

## 2023-08-30 DIAGNOSIS — Z853 Personal history of malignant neoplasm of breast: Secondary | ICD-10-CM | POA: Diagnosis present

## 2023-08-30 DIAGNOSIS — Z87891 Personal history of nicotine dependence: Secondary | ICD-10-CM | POA: Insufficient documentation

## 2023-08-30 DIAGNOSIS — Z171 Estrogen receptor negative status [ER-]: Secondary | ICD-10-CM | POA: Diagnosis not present

## 2023-08-30 NOTE — Progress Notes (Signed)
 [facility]  [address] [place],  [place]  [address] [phone]  Clinic Day: 08/30/2023  Referring physician: Leane Call, PA-C  CHIEF COMPLAINT:  CC: History of stage IA triple negative breast cancer   Current Treatment:  Surveillance  HISTORY OF PRESENT ILLNESS:  Erin Oliver is a 88 y.o. female with a history of stage IA (T1c N0 M0) triple negative right breast cancer diagnosed in February 2018.  She underwent lumpectomy and sentinel node biopsy.  Pathology revealed an 11 mm, grade 3, invasive ductal carcinoma with high-grade ductal carcinoma in situ.  Two lymph nodes were negative for metastasis.  Estrogen and progesterone receptors were negative and her 2 Neu negative.  Ki 67 was 80%.  She declined adjuvant chemotherapy.  She did undergo adjuvant radiation therapy to the right breast completed in May 2018.  She had cellulitis of the right breast right after completing radiation therapy with drainage from the breast.  She required multiple courses of antibiotics.  In September 2018, she fell and broke multiple ribs.  Evaluation in the emergency department with CT chest revealed nondisplaced right anterior rib fractures as well as a 2 cm nodular density within the right middle lobe.  Follow-up of the lung nodule in 3 months was recommended.  The patient is a former smoker, but quit 40 years ago.  Repeat CT chest was done in December 2018, at that time, radiation changes in the right middle lobe were felt to explain for nodular density seen on previous CT.  No further follow-up was recommended.  Dr. Judee Clara obtained a bone density scan in February 2019, which revealed osteopenia with a T-score -1.8 in the spine and a T-score -2.4 in the femur.  The patient had previously been on oral bisphosphonates, but did not tolerate these.  We offered the options of Prolia or Reclast, but she did not really wish to try either of these.  Bilateral diagnostic mammogram in February  2019 did not reveal any evidence of malignancy.  She had a skin cancer removed from her right face in July 2019.  She had a colonoscopy in August 2008 with findings of a 4 mm small sessile polyp of the rectum, 10 cm from the anal verge.   CT abdomen in early March 2020 revealed a new mild compression fracture of T10, and this was the area where she complained of severe pain, as well as her right rib cage.  We recommended an MRI thoracic spine scan, which confirmed a compression fracture.  We referred her for consultation with Interventional Radiology after the MRI scan, for possible kyphoplasty.  They did not feel she needed this procedure and was concerned that the pain was in the lumbar spine at that time, and radiating down her leg.  MRI lumbar spine was abnormal, but did not show fracture or evidence of metastatic disease.  She also saw Dr. Tressie Stalker, surgeon, in [place].  She was found to have deep venous thrombosis of the right lower extremity and August and was placed on Xarelto.  Review of her hospital record revealed the MRI thoracic spine to show old healed mild compression fractures of T6 and T10, with new facet arthritis at T8-9 and T9-10, left greater than right, without neural impingement or foraminal stenosis.  Bone density from May, 2021 revealed osteoporosis with a T-score of -3.3 of the left forearm.  Right femur neck is stable at -2.4, and dual femur total mean measures -2.0, previously -1.8. Bilateral diagnostic  mammogram from March 2022 was clear.    INTERVAL HISTORY:  Erin Oliver is here for routine follow up for history of stage IA triple negative breast cancer. Patient states that she feels ok but complains of constant right knee pain. She informed me that her husband expired 3 weeks ago and she is understandably still grieving. She had her annual mammogram done on 08/24/2023 that was clear. She had 1 fall since her last visit in her back yard. Her last bone density scan was May, 2021  and she notes taking Caltrate supplements BID. I will schedule a bone density scan now and notify her of the results. She may need to come in for a discussion of treatment for her osteoporosis. I will see her back in 1 year with bilateral screening mammogram.   She denies signs of infection such as sore throat, sinus drainage, cough, or urinary symptoms.  She denies fevers or recurrent chills. She denies nausea, vomiting, chest pain, dyspnea or cough. Her appetite is ok.   REVIEW OF SYSTEMS:  Review of Systems  Constitutional: Negative.  Negative for appetite change, chills, diaphoresis, fatigue, fever and unexpected weight change.  HENT:  Negative.  Negative for hearing loss, lump/mass, mouth sores, nosebleeds, sore throat, tinnitus, trouble swallowing and voice change.   Eyes: Negative.  Negative for eye problems and icterus.  Respiratory: Negative.  Negative for chest tightness, cough, hemoptysis, shortness of breath and wheezing.   Cardiovascular: Negative.  Negative for chest pain, leg swelling and palpitations.  Gastrointestinal: Negative.  Negative for abdominal distention, abdominal pain, blood in stool, constipation, diarrhea, nausea, rectal pain and vomiting.  Endocrine: Negative.   Genitourinary: Negative.  Negative for bladder incontinence, difficulty urinating, dyspareunia, dysuria, frequency, hematuria, menstrual problem, nocturia, pelvic pain, vaginal bleeding and vaginal discharge.   Musculoskeletal:  Positive for arthralgias (mild right knee pain) and back pain (chronic). Negative for flank pain, gait problem, myalgias, neck pain and neck stiffness.  Skin: Negative.  Negative for itching, rash and wound.  Neurological: Negative.  Negative for dizziness, extremity weakness, gait problem, headaches, light-headedness, numbness, seizures and speech difficulty.  Hematological: Negative.  Negative for adenopathy. Does not bruise/bleed easily.  Psychiatric/Behavioral: Negative.  Negative  for confusion, decreased concentration, depression, sleep disturbance and suicidal ideas. The patient is not nervous/anxious.      VITALS:  Blood pressure (!) 150/79, pulse 66, temperature 97.6 F (36.4 C), temperature source Oral, resp. rate 18, height 5' 1.5" (1.562 m), SpO2 98%.  Wt Readings from Last 3 Encounters:  08/30/22 174 lb 12.8 oz (79.3 kg)  03/04/22 169 lb 8 oz (76.9 kg)  03/04/21 176 lb 11.2 oz (80.2 kg)    Body mass index is 32.49 kg/m.  Performance status (ECOG): 1 - Symptomatic but completely ambulatory  PHYSICAL EXAM:  Physical Exam Vitals and nursing note reviewed.  Constitutional:      General: She is not in acute distress.    Appearance: Normal appearance. She is normal weight. She is not ill-appearing, toxic-appearing or diaphoretic.  HENT:     Head: Normocephalic and atraumatic.     Right Ear: Tympanic membrane, ear canal and external ear normal. There is no impacted cerumen.     Left Ear: Tympanic membrane, ear canal and external ear normal. There is no impacted cerumen.     Nose: Nose normal. No congestion or rhinorrhea.     Mouth/Throat:     Mouth: Mucous membranes are moist.     Pharynx: Oropharynx is clear. No oropharyngeal exudate  or posterior oropharyngeal erythema.  Eyes:     General: No scleral icterus.       Right eye: No discharge.        Left eye: No discharge.     Extraocular Movements: Extraocular movements intact.     Conjunctiva/sclera: Conjunctivae normal.     Pupils: Pupils are equal, round, and reactive to light.  Neck:     Vascular: No carotid bruit.     Comments: Prominent vascular blood vessel in the right anterior neck.  Cardiovascular:     Rate and Rhythm: Normal rate and regular rhythm.     Pulses: Normal pulses.     Heart sounds: Normal heart sounds. No murmur heard.    No friction rub. No gallop.  Pulmonary:     Effort: Pulmonary effort is normal. No respiratory distress.     Breath sounds: Normal breath sounds. No  stridor. No wheezing, rhonchi or rales.  Chest:     Chest wall: No tenderness.  Breasts:    Right: No mass.     Left: No mass.     Comments: Right breast has a deep scar in the lower outer quadrant which is well healed  Right axillary scar which is well healed Bilateral mild fibrocystic changes, but the right breast is tender No masses in either breast Abdominal:     General: Bowel sounds are normal. There is no distension.     Palpations: Abdomen is soft. There is no hepatomegaly, splenomegaly or mass.     Tenderness: There is no abdominal tenderness. There is no right CVA tenderness, left CVA tenderness, guarding or rebound.     Hernia: No hernia is present.  Musculoskeletal:        General: No swelling, tenderness, deformity or signs of injury. Normal range of motion.     Cervical back: Normal range of motion and neck supple. No rigidity or tenderness.     Right lower leg: No edema.     Left lower leg: No edema.  Lymphadenopathy:     Cervical: No cervical adenopathy.     Right cervical: No superficial, deep or posterior cervical adenopathy.    Left cervical: No superficial, deep or posterior cervical adenopathy.     Upper Body:     Right upper body: No supraclavicular, axillary or pectoral adenopathy.     Left upper body: No supraclavicular, axillary or pectoral adenopathy.  Skin:    General: Skin is warm and dry.     Coloration: Skin is not jaundiced or pale.     Findings: No bruising, erythema, lesion or rash.  Neurological:     General: No focal deficit present.     Mental Status: She is alert and oriented to person, place, and time. Mental status is at baseline.     Cranial Nerves: No cranial nerve deficit.     Sensory: No sensory deficit.     Motor: No weakness.     Coordination: Coordination normal.     Gait: Gait normal.     Deep Tendon Reflexes: Reflexes normal.  Psychiatric:        Mood and Affect: Mood normal.        Behavior: Behavior normal.        Thought  Content: Thought content normal.        Judgment: Judgment normal.    LABS:      Latest Ref Rng & Units 03/04/2022   12:00 AM 03/04/2021   12:00 AM 08/11/2020   12:00  AM  CBC  WBC  6.5     6.4  6.9      Hemoglobin 12.0 - 16.0 14.1     12.9  13.6      Hematocrit 36 - 46 42     39  41      Platelets 150 - 400 K/uL 372     328  380         This result is from an external source.      Latest Ref Rng & Units 03/04/2022   12:00 AM 03/04/2021   12:00 AM 08/11/2020   12:00 AM  CMP  BUN 4 - 21 21     16  25       Creatinine 0.5 - 1.1 1.1     1.1  1.2      Sodium 137 - 147 138     138  138      Potassium 3.5 - 5.1 mEq/L 4.2     4.5  4.3      Chloride 99 - 108 106     106  104      CO2 13 - 22 27     22  26       Calcium 8.7 - 10.7 9.2     8.9  8.9      Alkaline Phos 25 - 125 68     61  72      AST 13 - 35 24     28  31       ALT 7 - 35 U/L 18     14  19          This result is from an external source.    STUDIES:  Exam: 08/24/2023 Digital Screening Bilateral Mammogram with Tomosynthesis  Impression: No mammographic evidence of malignancy   HISTORY:   Allergies:  Allergies  Allergen Reactions   Hydrocodone-Acetaminophen Other (See Comments)    Other reaction(s): Mental Status Changes (intolerance)   Penicillins Hives    Has patient had a PCN reaction causing immediate rash, facial/tongue/throat swelling, SOB or lightheadedness with hypotension: Yes  Has patient had a PCN reaction causing severe rash involving mucus membranes or skin necrosis: No  Has patient had a PCN reaction that required hospitalization No  Has patient had a PCN reaction occurring within the last 10 years: No  If all of the above answers are "NO", then may proceed with Cephalosporin use.  Other reaction(s): Hives  Other reaction(s): Hives, Has patient had a PCN reaction causing immediate rash, facial/tongue/throat swelling, SOB or lightheadedness with hypotension: Yes, Has patient had a PCN reaction  causing severe rash involving mucus membranes or skin necrosis: No, Has patient had a PCN reaction that required hospitalization No, Has patient had a PCN reaction occurring within the last 10 years: No, If all of the above answers are "NO", then may proceed with Cephalosporin use., Has patient had a PCN reaction caus   Rivaroxaban Rash    Current Medications: Current Outpatient Medications  Medication Sig Dispense Refill   losartan (COZAAR) 100 MG tablet Take 1 tablet by mouth daily.     amLODipine (NORVASC) 10 MG tablet Take 10 mg by mouth daily.     Ascorbic Acid (VITAMIN C) 100 MG tablet Take by mouth daily.     aspirin 81 MG chewable tablet Chew 1 tablet (81 mg total) by mouth daily. 30 tablet 0   bisoprolol (ZEBETA) 5 MG tablet Take 1 tablet by mouth daily.  calcium carbonate (OSCAL) 1500 (600 Ca) MG TABS tablet Take 1,500 mg by mouth 2 (two) times daily with a meal.     cholecalciferol (VITAMIN D) 1000 units tablet Take 1,000 Units by mouth daily.     fluticasone (FLONASE) 50 MCG/ACT nasal spray Place 1 spray into both nostrils 2 (two) times daily.     levothyroxine (SYNTHROID) 25 MCG tablet Take 25 mcg by mouth daily before breakfast.     lovastatin (MEVACOR) 40 MG tablet Take 40 mg by mouth at bedtime.     meclizine (ANTIVERT) 25 MG tablet Take 1 tablet by mouth three times daily as needed for dizziness     omeprazole (PRILOSEC) 20 MG capsule Take 20 mg by mouth daily.     zinc sulfate 220 (50 Zn) MG capsule Take 220 mg by mouth daily.     No current facility-administered medications for this visit.     ASSESSMENT & PLAN:  Assessment:   1. Stage IA triple negative breast cancer diagnosed in February, 2018, treated with surgery and adjuvant radiation.  She declined adjuvant chemotherapy due to age.  She remains without evidence of recurrence.  2. Compression fracture T10 with significant degenerative disc disease.  She did not undergo kyphoplasty, and instead proceeded with  physical therapy for 6 weeks.  Her pain has improved and she is able to do her daily activities again.  3. Osteoporosis with her last bone density scan done in May, 2021.  She has been getting these through her primary care provider.  She has declined treatment for her bones, but is on oral calcium and vitamin D.  4. History of colon polyps.  It has been over 10 years, so I recommended repeat colonoscopy.  She has not done this yet.  She states she sees Dr. Chales Abrahams in [facility].  5. History of skin cancers.  6. Former smoker.  Plan: She informed me that her husband expired 3 weeks ago and she is understandably still grieving. She had her annual mammogram done on 08/24/2023 that was clear. She had 1 fall since her last visit in her back yard. Her last bone density scan was May, 2021 and she notes taking Caltrate supplements BID. I will schedule a bone density scan now and notify her of the results. She may need to come in for a discussion of treatment for her osteoporosis. I will see her back in 1 year with bilateral screening mammogram. She understands and agrees with the plans discussed today. She knows to call the office should any new questions or concerns arise.   I provided 20 minutes of face-to-face time during this encounter and > 50% was spent counseling as documented under my assessment and plan.   Dellia Beckwith, MD [facility] - [facility] OF [facility] [address] [place] [address] Dept: [phone] Dept Fax: [phone]   No orders of the defined types were placed in this encounter.  I,Jasmine M Lassiter,acting as a scribe for Dellia Beckwith, MD.,have documented all relevant documentation on the behalf of Dellia Beckwith, MD,as directed by  Dellia Beckwith, MD while in the presence of Dellia Beckwith, MD.  I have reviewed this report as typed by the medical scribe, and it is complete and  accurate.

## 2023-08-30 NOTE — Telephone Encounter (Signed)
 Patient has been scheduled for follow-up visit per 08/30/23 LOS.  Pt given an appt calendar with date and time.

## 2023-08-31 ENCOUNTER — Encounter: Payer: Self-pay | Admitting: Oncology

## 2023-09-08 ENCOUNTER — Encounter: Payer: Self-pay | Admitting: Oncology

## 2023-09-13 ENCOUNTER — Ambulatory Visit (HOSPITAL_BASED_OUTPATIENT_CLINIC_OR_DEPARTMENT_OTHER)
Admission: RE | Admit: 2023-09-13 | Discharge: 2023-09-13 | Disposition: A | Discharge status: Home / Self Care | Source: Ambulatory Visit | Attending: Oncology | Admitting: Oncology

## 2023-09-13 DIAGNOSIS — M810 Age-related osteoporosis without current pathological fracture: Secondary | ICD-10-CM

## 2024-08-27 ENCOUNTER — Ambulatory Visit (HOSPITAL_BASED_OUTPATIENT_CLINIC_OR_DEPARTMENT_OTHER): Admitting: Radiology

## 2024-08-29 ENCOUNTER — Ambulatory Visit: Admitting: Oncology
